# Patient Record
Sex: Male | Born: 1945 | Race: White | Hispanic: No | Marital: Married | Smoking: Never smoker
Health system: Southern US, Community
[De-identification: ages and names within clinical notes are randomized; demographics above are authoritative.]

## PROBLEM LIST (undated history)

## (undated) DIAGNOSIS — M25569 Pain in unspecified knee: Secondary | ICD-10-CM

## (undated) DIAGNOSIS — H938X9 Other specified disorders of ear, unspecified ear: Secondary | ICD-10-CM

## (undated) DIAGNOSIS — J309 Allergic rhinitis, unspecified: Secondary | ICD-10-CM

## (undated) DIAGNOSIS — R51 Headache: Secondary | ICD-10-CM

## (undated) DIAGNOSIS — M5412 Radiculopathy, cervical region: Secondary | ICD-10-CM

## (undated) DIAGNOSIS — M1990 Unspecified osteoarthritis, unspecified site: Secondary | ICD-10-CM

## (undated) DIAGNOSIS — G4733 Obstructive sleep apnea (adult) (pediatric): Secondary | ICD-10-CM

## (undated) DIAGNOSIS — I2510 Atherosclerotic heart disease of native coronary artery without angina pectoris: Secondary | ICD-10-CM

## (undated) DIAGNOSIS — I10 Essential (primary) hypertension: Secondary | ICD-10-CM

## (undated) DIAGNOSIS — Z9989 Dependence on other enabling machines and devices: Secondary | ICD-10-CM

## (undated) DIAGNOSIS — M722 Plantar fascial fibromatosis: Secondary | ICD-10-CM

## (undated) DIAGNOSIS — N529 Male erectile dysfunction, unspecified: Secondary | ICD-10-CM

## (undated) DIAGNOSIS — T07XXXA Unspecified multiple injuries, initial encounter: Secondary | ICD-10-CM

## (undated) DIAGNOSIS — F32A Depression, unspecified: Secondary | ICD-10-CM

## (undated) DIAGNOSIS — L709 Acne, unspecified: Secondary | ICD-10-CM

## (undated) DIAGNOSIS — F329 Major depressive disorder, single episode, unspecified: Secondary | ICD-10-CM

## (undated) DIAGNOSIS — R519 Headache, unspecified: Secondary | ICD-10-CM

## (undated) DIAGNOSIS — E669 Obesity, unspecified: Secondary | ICD-10-CM

## (undated) HISTORY — DX: Male erectile dysfunction, unspecified: N52.9

## (undated) HISTORY — PX: TYMPANOSTOMY TUBE PLACEMENT: SHX32

## (undated) HISTORY — DX: Atherosclerotic heart disease of native coronary artery without angina pectoris: I25.10

## (undated) HISTORY — DX: Obesity, unspecified: E66.9

## (undated) HISTORY — PX: CARDIAC CATHETERIZATION: SHX172

## (undated) HISTORY — DX: Essential (primary) hypertension: I10

## (undated) HISTORY — DX: Allergic rhinitis, unspecified: J30.9

## (undated) HISTORY — DX: Pain in unspecified knee: M25.569

## (undated) HISTORY — DX: Unspecified osteoarthritis, unspecified site: M19.90

## (undated) HISTORY — DX: Plantar fascial fibromatosis: M72.2

## (undated) HISTORY — PX: ELBOW SURGERY: SHX618

## (undated) HISTORY — DX: Obstructive sleep apnea (adult) (pediatric): G47.33

## (undated) HISTORY — DX: Acne, unspecified: L70.9

## (undated) HISTORY — DX: Other specified disorders of ear, unspecified ear: H93.8X9

## (undated) HISTORY — DX: Radiculopathy, cervical region: M54.12

## (undated) HISTORY — DX: Depression, unspecified: F32.A

## (undated) HISTORY — DX: Major depressive disorder, single episode, unspecified: F32.9

## (undated) HISTORY — DX: Dependence on other enabling machines and devices: Z99.89

## (undated) HISTORY — PX: TONSILLECTOMY: SUR1361

## (undated) HISTORY — PX: NASAL SINUS SURGERY: SHX719

## (undated) HISTORY — PX: HERNIA REPAIR: SHX51

## (undated) HISTORY — PX: KNEE ARTHROSCOPY: SHX127

## (undated) HISTORY — DX: Unspecified multiple injuries, initial encounter: T07.XXXA

---

## 1998-04-14 HISTORY — PX: CORONARY ARTERY BYPASS GRAFT: SHX141

## 2006-07-08 ENCOUNTER — Encounter: Admission: RE | Admit: 2006-07-08 | Discharge: 2006-07-08 | Payer: Self-pay | Admitting: Interventional Cardiology

## 2007-09-02 ENCOUNTER — Encounter: Admission: RE | Admit: 2007-09-02 | Discharge: 2007-09-02 | Payer: Self-pay | Admitting: Internal Medicine

## 2009-05-25 ENCOUNTER — Ambulatory Visit (HOSPITAL_BASED_OUTPATIENT_CLINIC_OR_DEPARTMENT_OTHER): Admission: RE | Admit: 2009-05-25 | Discharge: 2009-05-25 | Payer: Self-pay | Admitting: Orthopedic Surgery

## 2010-04-19 ENCOUNTER — Ambulatory Visit
Admission: RE | Admit: 2010-04-19 | Discharge: 2010-04-19 | Payer: Self-pay | Source: Home / Self Care | Attending: Orthopedic Surgery | Admitting: Orthopedic Surgery

## 2010-04-19 LAB — POCT I-STAT, CHEM 8
BUN: 20 mg/dL (ref 6–23)
Calcium, Ion: 1.08 mmol/L — ABNORMAL LOW (ref 1.12–1.32)
Chloride: 106 mEq/L (ref 96–112)
Creatinine, Ser: 1 mg/dL (ref 0.4–1.5)
Glucose, Bld: 133 mg/dL — ABNORMAL HIGH (ref 70–99)
HCT: 43 % (ref 39.0–52.0)
Hemoglobin: 14.6 g/dL (ref 13.0–17.0)
Potassium: 3.7 mEq/L (ref 3.5–5.1)
Sodium: 137 mEq/L (ref 135–145)
TCO2: 26 mmol/L (ref 0–100)

## 2010-05-03 NOTE — Op Note (Signed)
  NAMEOMARIAN, Scott Welch                ACCOUNT NO.:  [id number]  MEDICAL RECORD NO.:  [id number]          PATIENT TYPE:  AMB  LOCATION:  DSC                          FACILITY:  [facility]  PHYSICIAN:  Harvie Junior, M.D.   DATE OF BIRTH:  [birth year]  DATE OF PROCEDURE:  04/19/2010 DATE OF DISCHARGE:                              OPERATIVE REPORT   PREOPERATIVE DIAGNOSIS:  Medial meniscal tear.  POSTOPERATIVE DIAGNOSES: 1. Medial meniscal tear. 2. Lateral meniscal tear. 3. Significant chondromalacia lateral femoral condyle. 4. Chondromalacia patellofemoral joint.  PROCEDURE: 1. Partial medial and partial lateral meniscectomy with corresponding     debridement in the mediolateral compartment. 2. Debridement of chondromalacia of the patella down to bleeding bone.  SURGEON:  Harvie Junior, MD  ASSISTANT:  Marshia Ly, PA  ANESTHESIA:  General.  BRIEF HISTORY:  Mr. Scott Welch is a 65 year old male with history of having significant bilateral knee pain.  We treated his left knee with a knee arthroscopy done, but that ultimately began having increasing and similar symptoms on the right knee after failure of conservative care. He was ultimately taken to the operating room for right knee arthroscopy.  PROCEDURE:  The patient was taken to the operating room.  After adequate anesthesia was obtained with general anesthetic, the patient was placed supine on the operating table.  The right leg was prepped and draped in usual sterile fashion.  Following this, the knee was examined under anesthesia and noted to be stable in all directions.  The arthroscopy instruments were then placed, and attention was turned to the medial compartments and noted to have significant calcification in the meniscus.  Inferiorly, I think it was some chondrocalcinosis, but ultimately we did a debridement of the posterior horn of the medial meniscus back to a smooth and stable rim.  Medial femoral condyle had  no significant chondromalacia.  Attention was turned to the ACL.  Normal lateral side showed grade 2 and 3 chondromalacia of the lateral femoral condyle over a pretty large area, probably 2 x 2 cm.  The lateral meniscus was torn at its periphery and we debrided this back to a stable rim.  Attention was then turned up to the patellofemoral joint where some grade 3 and 4 changes debrided back to a stable rim of bleeding bone.  At this point, the knee was copiously and thoroughly lavaged and suctioned dry.  The arthroscopic portals were closed with a bandage.  Sterile compression was applied. The patient taken to recovery room where he was noted to be in satisfactory condition.  Estimated blood loss for the procedure was none.     Harvie Junior, M.D.     Ranae Plumber  D:  04/19/2010  T:  04/20/2010  Job:  [id number]  Electronically Signed by Jodi Geralds M.D. on 05/02/2010 08:59:22 PM

## 2010-07-04 LAB — POCT HEMOGLOBIN-HEMACUE: Hemoglobin: 15.4 g/dL (ref 13.0–17.0)

## 2010-07-04 LAB — BASIC METABOLIC PANEL
BUN: 23 mg/dL (ref 6–23)
CO2: 26 mEq/L (ref 19–32)
Calcium: 9.3 mg/dL (ref 8.4–10.5)
Chloride: 102 mEq/L (ref 96–112)
Creatinine, Ser: 1.08 mg/dL (ref 0.4–1.5)
GFR calc Af Amer: >60 mL/min (ref >60)
GFR calc non Af Amer: >60 mL/min (ref >60)
Glucose, Bld: 119 mg/dL — ABNORMAL HIGH (ref 70–99)
Potassium: 4.1 mEq/L (ref 3.5–5.1)
Sodium: 135 mEq/L (ref 135–145)

## 2011-07-23 DIAGNOSIS — D1800 Hemangioma unspecified site: Secondary | ICD-10-CM | POA: Diagnosis not present

## 2011-07-23 DIAGNOSIS — L570 Actinic keratosis: Secondary | ICD-10-CM | POA: Diagnosis not present

## 2011-07-23 DIAGNOSIS — L821 Other seborrheic keratosis: Secondary | ICD-10-CM | POA: Diagnosis not present

## 2011-07-23 DIAGNOSIS — Z85828 Personal history of other malignant neoplasm of skin: Secondary | ICD-10-CM | POA: Diagnosis not present

## 2011-12-02 DIAGNOSIS — R51 Headache: Secondary | ICD-10-CM | POA: Diagnosis not present

## 2011-12-30 DIAGNOSIS — Z1211 Encounter for screening for malignant neoplasm of colon: Secondary | ICD-10-CM | POA: Diagnosis not present

## 2011-12-30 DIAGNOSIS — K5730 Diverticulosis of large intestine without perforation or abscess without bleeding: Secondary | ICD-10-CM | POA: Diagnosis not present

## 2012-01-01 DIAGNOSIS — M1710 Unilateral primary osteoarthritis, unspecified knee: Secondary | ICD-10-CM | POA: Diagnosis not present

## 2012-01-01 DIAGNOSIS — M25569 Pain in unspecified knee: Secondary | ICD-10-CM | POA: Diagnosis not present

## 2012-01-12 DIAGNOSIS — M1710 Unilateral primary osteoarthritis, unspecified knee: Secondary | ICD-10-CM | POA: Diagnosis not present

## 2012-01-21 DIAGNOSIS — I10 Essential (primary) hypertension: Secondary | ICD-10-CM | POA: Diagnosis not present

## 2012-01-21 DIAGNOSIS — E785 Hyperlipidemia, unspecified: Secondary | ICD-10-CM | POA: Diagnosis not present

## 2012-01-21 DIAGNOSIS — I2510 Atherosclerotic heart disease of native coronary artery without angina pectoris: Secondary | ICD-10-CM | POA: Diagnosis not present

## 2012-02-03 DIAGNOSIS — F325 Major depressive disorder, single episode, in full remission: Secondary | ICD-10-CM | POA: Diagnosis not present

## 2012-02-03 DIAGNOSIS — Z125 Encounter for screening for malignant neoplasm of prostate: Secondary | ICD-10-CM | POA: Diagnosis not present

## 2012-02-03 DIAGNOSIS — E785 Hyperlipidemia, unspecified: Secondary | ICD-10-CM | POA: Diagnosis not present

## 2012-02-03 DIAGNOSIS — Z0000 Encounter for general adult medical examination without abnormal findings: Secondary | ICD-10-CM | POA: Diagnosis not present

## 2012-02-03 DIAGNOSIS — Z1331 Encounter for screening for depression: Secondary | ICD-10-CM | POA: Diagnosis not present

## 2012-02-03 DIAGNOSIS — I10 Essential (primary) hypertension: Secondary | ICD-10-CM | POA: Diagnosis not present

## 2012-02-03 DIAGNOSIS — Z23 Encounter for immunization: Secondary | ICD-10-CM | POA: Diagnosis not present

## 2012-02-03 DIAGNOSIS — G4733 Obstructive sleep apnea (adult) (pediatric): Secondary | ICD-10-CM | POA: Diagnosis not present

## 2012-05-18 DIAGNOSIS — M1710 Unilateral primary osteoarthritis, unspecified knee: Secondary | ICD-10-CM | POA: Diagnosis not present

## 2012-07-29 DIAGNOSIS — M1710 Unilateral primary osteoarthritis, unspecified knee: Secondary | ICD-10-CM | POA: Diagnosis not present

## 2012-08-03 DIAGNOSIS — M25569 Pain in unspecified knee: Secondary | ICD-10-CM | POA: Diagnosis not present

## 2012-08-03 DIAGNOSIS — F329 Major depressive disorder, single episode, unspecified: Secondary | ICD-10-CM | POA: Diagnosis not present

## 2012-08-03 DIAGNOSIS — I10 Essential (primary) hypertension: Secondary | ICD-10-CM | POA: Diagnosis not present

## 2012-08-05 DIAGNOSIS — M1710 Unilateral primary osteoarthritis, unspecified knee: Secondary | ICD-10-CM | POA: Diagnosis not present

## 2012-08-05 DIAGNOSIS — L821 Other seborrheic keratosis: Secondary | ICD-10-CM | POA: Diagnosis not present

## 2012-08-05 DIAGNOSIS — Z85828 Personal history of other malignant neoplasm of skin: Secondary | ICD-10-CM | POA: Diagnosis not present

## 2012-08-05 DIAGNOSIS — L570 Actinic keratosis: Secondary | ICD-10-CM | POA: Diagnosis not present

## 2012-08-05 DIAGNOSIS — I781 Nevus, non-neoplastic: Secondary | ICD-10-CM | POA: Diagnosis not present

## 2012-08-12 DIAGNOSIS — M1710 Unilateral primary osteoarthritis, unspecified knee: Secondary | ICD-10-CM | POA: Diagnosis not present

## 2012-08-12 DIAGNOSIS — M25569 Pain in unspecified knee: Secondary | ICD-10-CM | POA: Diagnosis not present

## 2012-08-24 DIAGNOSIS — M1710 Unilateral primary osteoarthritis, unspecified knee: Secondary | ICD-10-CM | POA: Diagnosis not present

## 2012-12-22 DIAGNOSIS — L570 Actinic keratosis: Secondary | ICD-10-CM | POA: Diagnosis not present

## 2012-12-22 DIAGNOSIS — D234 Other benign neoplasm of skin of scalp and neck: Secondary | ICD-10-CM | POA: Diagnosis not present

## 2012-12-22 DIAGNOSIS — Z85828 Personal history of other malignant neoplasm of skin: Secondary | ICD-10-CM | POA: Diagnosis not present

## 2012-12-22 DIAGNOSIS — L820 Inflamed seborrheic keratosis: Secondary | ICD-10-CM | POA: Diagnosis not present

## 2013-01-20 ENCOUNTER — Encounter: Payer: Self-pay | Admitting: Interventional Cardiology

## 2013-01-21 ENCOUNTER — Ambulatory Visit: Payer: Self-pay | Admitting: Interventional Cardiology

## 2013-02-01 DIAGNOSIS — G4733 Obstructive sleep apnea (adult) (pediatric): Secondary | ICD-10-CM | POA: Diagnosis not present

## 2013-02-01 DIAGNOSIS — I10 Essential (primary) hypertension: Secondary | ICD-10-CM | POA: Diagnosis not present

## 2013-02-01 DIAGNOSIS — E785 Hyperlipidemia, unspecified: Secondary | ICD-10-CM | POA: Diagnosis not present

## 2013-02-01 DIAGNOSIS — Z23 Encounter for immunization: Secondary | ICD-10-CM | POA: Diagnosis not present

## 2013-02-03 DIAGNOSIS — M25569 Pain in unspecified knee: Secondary | ICD-10-CM | POA: Diagnosis not present

## 2013-02-15 ENCOUNTER — Other Ambulatory Visit: Payer: Self-pay | Admitting: Orthopedic Surgery

## 2013-02-22 ENCOUNTER — Encounter: Payer: Self-pay | Admitting: Interventional Cardiology

## 2013-02-22 ENCOUNTER — Ambulatory Visit (INDEPENDENT_AMBULATORY_CARE_PROVIDER_SITE_OTHER): Payer: Medicare Other | Admitting: Interventional Cardiology

## 2013-02-22 VITALS — BP 144/80 | HR 66 | Ht 74.0 in | Wt 248.0 lb

## 2013-02-22 DIAGNOSIS — Z01810 Encounter for preprocedural cardiovascular examination: Secondary | ICD-10-CM

## 2013-02-22 DIAGNOSIS — G4733 Obstructive sleep apnea (adult) (pediatric): Secondary | ICD-10-CM | POA: Diagnosis not present

## 2013-02-22 DIAGNOSIS — I25709 Atherosclerosis of coronary artery bypass graft(s), unspecified, with unspecified angina pectoris: Secondary | ICD-10-CM | POA: Insufficient documentation

## 2013-02-22 DIAGNOSIS — I10 Essential (primary) hypertension: Secondary | ICD-10-CM | POA: Diagnosis not present

## 2013-02-22 DIAGNOSIS — IMO0002 Reserved for concepts with insufficient information to code with codable children: Secondary | ICD-10-CM

## 2013-02-22 DIAGNOSIS — I2510 Atherosclerotic heart disease of native coronary artery without angina pectoris: Secondary | ICD-10-CM | POA: Diagnosis not present

## 2013-02-22 DIAGNOSIS — E785 Hyperlipidemia, unspecified: Secondary | ICD-10-CM | POA: Diagnosis not present

## 2013-02-22 DIAGNOSIS — M1710 Unilateral primary osteoarthritis, unspecified knee: Secondary | ICD-10-CM

## 2013-02-22 DIAGNOSIS — M1712 Unilateral primary osteoarthritis, left knee: Secondary | ICD-10-CM | POA: Insufficient documentation

## 2013-02-22 NOTE — Patient Instructions (Signed)
Your physician recommends that you continue on your current medications as directed. Please refer to the Current Medication list given to you today.  Your physician wants you to follow-up in: 1 year You will receive a reminder letter in the mail two months in advance. If you don't receive a letter, please call our office to schedule the follow-up appointment.  You are cleared to have your left knee surgery in December

## 2013-02-22 NOTE — Progress Notes (Signed)
Patient ID: Scott Welch, male   DOB: 1945/11/29, 67 y.o.   MRN: [id number]    [address] [place], [place]  [address] Phone: [phone] Fax:  [phone]  Date:  02/22/2013   ID:  Scott Welch, DOB 06-Feb-1946, MRN [id number]  PCP:  Lillia Mountain, MD   ASSESSMENT:  1. Coronary atherosclerotic heart disease, stable without angina 2. Hypertension with good control 3. Hyperlipidemia, on therapy with Lipitor 4. Chronic left knee osteoarthritis, with upcoming left knee replacement by Dr. Luiz Blare  PLAN:  1. Patient is cleared for the upcoming left knee operation. Percent today's note for Dr. Luiz Blare. The surgical  risk should be low to moderate (less than 5%) 2. Active lifestyle 3. Clinical followup in one year 4. No change in medical therapy   SUBJECTIVE: Scott Welch is a 67 y.o. male is having some difficulty sleeping because of sleep apnea and poorly fitting device. He has not had angina. He recently spent a week in [place], where he walked up to 5 miles each day without difficulty. He denies orthopnea, PND, chest pain, edema, and palpitations. He does get leg cramps. While in [place] he takes supplemental potassium and this seemed to help the cramping. His only limitation during the trip was pain in his left knee. This will be operated upon by Dr. Luiz Blare in early December. He will be a total knee replacement.   Wt Readings from Last 3 Encounters:  02/22/13 248 lb (112.492 kg)     Past Medical History  Diagnosis Date  . Hypertension   . Depression   . Knee pain     followed by Dr. Luiz Blare at [facility]  . Coronary artery disease     one-vessel followed by Dr. Katrinka Blazing  . OSA on CPAP     since 1995. BIPAP  . Obesity   . Erectile dysfunction   . Osteoarthritis     left knee, Dr. Luiz Blare  . Allergic rhinitis   . Plantar fasciitis   . Cervical radiculopathy     2008  . Acne   . DJD (degenerative joint disease)    [place]  . Ear congestion     ear tubes, [place]  . Fractures     left elbow, right elbow, nose, multiple fingers, left clavicle    Current Outpatient Prescriptions  Medication Sig Dispense Refill  . aspirin 81 MG tablet Take 81 mg by mouth daily.      Marland Kitchen atorvastatin (LIPITOR) 20 MG tablet Take 20 mg by mouth daily.      . cetirizine (ZYRTEC) 10 MG tablet Take 10 mg by mouth daily.      . cholecalciferol (VITAMIN D) 1000 UNITS tablet Take 1,000 Units by mouth daily.      . fluticasone (FLONASE) 50 MCG/ACT nasal spray Place 2 sprays into the nose daily.      . Ginkgo Biloba (GINKOBA) 40 MG TABS Take 40 mg by mouth.      Marland Kitchen lisinopril (PRINIVIL,ZESTRIL) 20 MG tablet Take 20 mg by mouth daily.      . Misc Natural Products (OSTEO BI-FLEX TRIPLE STRENGTH PO) Take by mouth.      . Multiple Vitamin (MULTIVITAMIN) tablet Take 1 tablet by mouth daily.      . sildenafil (VIAGRA) 50 MG tablet Take 50 mg by mouth daily as needed for erectile dysfunction.       No current facility-administered medications for this visit.  Allergies:   No Known Allergies  History   Social History  . Marital Status: Married    Spouse Name: N/A    Number of Children: N/A  . Years of Education: N/A   Occupational History  . Not on file.   Social History Main Topics  . Smoking status: Never Smoker   . Smokeless tobacco: Not on file  . Alcohol Use: 8.4 oz/week    14 Glasses of wine per week  . Drug Use: Not on file  . Sexual Activity: Not on file   Other Topics Concern  . Not on file   Social History Narrative  . No narrative on file    ROS:  Please see the history of present illness.   Denies claudication, transient neurological symptoms, bleeding, syncope, orthopnea, PND, and medication side effects.   All other systems reviewed and negative.   OBJECTIVE: VS:  BP 144/80  Pulse 66  Ht 6\' 2"  (1.88 m)  Wt 248 lb (112.492 kg)  BMI 31.83 kg/m2 Well nourished, well developed, in no acute  distress,  obese  HEENT: normal Neck: JVD  flat. Carotid bruit  absent   Cardiac:  normal S1, S2; RRR; no murmur Lungs: Clear to auscultation bilaterally, no wheezing, rhonchi or rales Abd: soft, nontender, no hepatomegaly Ext: Edema trace to 1+ bilateral. Pulses trace to 1+ bilateral Skin: warm and dry Neuro:  CNs 2-12 intact, no focal abnormalities noted  EKG:  normal sinus rhythm, PVCs, occasionally and a bigeminal pattern.    Signed, Darci Needle III, MD 02/22/2013 12:13 PM

## 2013-02-24 DIAGNOSIS — G4710 Hypersomnia, unspecified: Secondary | ICD-10-CM | POA: Diagnosis not present

## 2013-02-24 DIAGNOSIS — G4733 Obstructive sleep apnea (adult) (pediatric): Secondary | ICD-10-CM | POA: Diagnosis not present

## 2013-02-24 DIAGNOSIS — R0609 Other forms of dyspnea: Secondary | ICD-10-CM | POA: Diagnosis not present

## 2013-02-24 DIAGNOSIS — G4730 Sleep apnea, unspecified: Secondary | ICD-10-CM | POA: Diagnosis not present

## 2013-03-07 ENCOUNTER — Encounter (HOSPITAL_COMMUNITY): Payer: Self-pay

## 2013-03-07 NOTE — Pre-Procedure Instructions (Signed)
Scott Welch  03/07/2013   Your procedure is scheduled on:  December 5th-Friday   Report to [facility]  2 * 3 at 8:00 AM.             [facility] Parking is available)  Call this number if you have problems the morning of surgery: [phone]   Remember:   Do not eat food or drink liquids after midnight Thursday.   Take these medicines the morning of surgery with A SIP OF WATER: Lexapro, Zyrtec.  Please use flonase too.   Do not wear jewelry.  Do not wear lotions, powders, or colognes. You may wear deodorant.    Men may shave face and neck.  Do not bring valuables to the hospital.  [facility] is not responsible for any belongings or valuables.               Contacts, dentures or bridgework may not be worn into surgery.  Leave suitcase in the car. After surgery it may be brought to your room.  For patients admitted to the hospital, discharge time is determined by your treatment team.              Name and phone number of your driver:    Special Instructions: Shower using CHG 2 nights before surgery and the night before surgery.  If you shower the day of surgery use CHG.  Use special wash - you have one bottle of CHG for all showers.  You should use approximately 1/3 of the bottle for each shower.   Please read over the following fact sheets that you were given: Pain Booklet, Coughing and Deep Breathing, Blood Transfusion Information, MRSA Information and Surgical Site Infection Prevention

## 2013-03-08 ENCOUNTER — Encounter (HOSPITAL_COMMUNITY): Payer: Self-pay

## 2013-03-08 ENCOUNTER — Encounter (HOSPITAL_COMMUNITY)
Admission: RE | Admit: 2013-03-08 | Discharge: 2013-03-08 | Disposition: A | Discharge status: Home / Self Care | Payer: Medicare Other | Source: Ambulatory Visit | Attending: Orthopedic Surgery | Admitting: Orthopedic Surgery

## 2013-03-08 DIAGNOSIS — Z01818 Encounter for other preprocedural examination: Secondary | ICD-10-CM | POA: Diagnosis not present

## 2013-03-08 DIAGNOSIS — I10 Essential (primary) hypertension: Secondary | ICD-10-CM | POA: Diagnosis not present

## 2013-03-08 DIAGNOSIS — Z01812 Encounter for preprocedural laboratory examination: Secondary | ICD-10-CM | POA: Insufficient documentation

## 2013-03-08 LAB — COMPREHENSIVE METABOLIC PANEL
ALT: 26 U/L (ref 0–53)
AST: 21 U/L (ref 0–37)
Calcium: 9 mg/dL (ref 8.4–10.5)
Creatinine, Ser: 1.03 mg/dL (ref 0.50–1.35)
GFR calc Af Amer: 84 mL/min — ABNORMAL LOW (ref >90)
Glucose, Bld: 111 mg/dL — ABNORMAL HIGH (ref 70–99)
Sodium: 140 mEq/L (ref 135–145)
Total Protein: 6.4 g/dL (ref 6.0–8.3)

## 2013-03-08 LAB — URINALYSIS, ROUTINE W REFLEX MICROSCOPIC
Bilirubin Urine: NEGATIVE
Glucose, UA: NEGATIVE mg/dL
Hgb urine dipstick: NEGATIVE
Ketones, ur: NEGATIVE mg/dL
Protein, ur: NEGATIVE mg/dL
Urobilinogen, UA: 0.2 mg/dL (ref 0.0–1.0)
pH: 6.5 (ref 5.0–8.0)

## 2013-03-08 LAB — TYPE AND SCREEN
ABO/RH(D): A POS
Antibody Screen: NEGATIVE

## 2013-03-08 LAB — SURGICAL PCR SCREEN
MRSA, PCR: NEGATIVE
Staphylococcus aureus: NEGATIVE

## 2013-03-08 LAB — CBC WITH DIFFERENTIAL/PLATELET
Basophils Absolute: 0.1 10*3/uL (ref 0.0–0.1)
Basophils Relative: 1 % (ref 0–1)
Eosinophils Absolute: 0.3 10*3/uL (ref 0.0–0.7)
Eosinophils Relative: 6 % — ABNORMAL HIGH (ref 0–5)
Lymphs Abs: 1.3 10*3/uL (ref 0.7–4.0)
MCH: 32 pg (ref 26.0–34.0)
MCV: 93.3 fL (ref 78.0–100.0)
Monocytes Absolute: 0.4 10*3/uL (ref 0.1–1.0)
Platelets: 165 10*3/uL (ref 150–400)
RDW: 12.5 % (ref 11.5–15.5)

## 2013-03-08 LAB — ABO/RH: ABO/RH(D): A POS

## 2013-03-08 LAB — PROTIME-INR: INR: 1.02 (ref 0.00–1.49)

## 2013-03-17 MED ORDER — CEFAZOLIN SODIUM-DEXTROSE 2-3 GM-% IV SOLR
2.0000 g | INTRAVENOUS | Status: AC
  Administered 2013-03-18: 2 g via INTRAVENOUS
  Filled 2013-03-17: qty 50

## 2013-03-18 ENCOUNTER — Encounter (HOSPITAL_COMMUNITY): Payer: Self-pay

## 2013-03-18 ENCOUNTER — Inpatient Hospital Stay (HOSPITAL_COMMUNITY)
Admission: RE | Admit: 2013-03-18 | Discharge: 2013-03-20 | DRG: 470 | Disposition: A | Discharge status: Home / Self Care | Payer: Medicare Other | Source: Ambulatory Visit | Attending: Orthopedic Surgery | Admitting: Orthopedic Surgery

## 2013-03-18 ENCOUNTER — Inpatient Hospital Stay (HOSPITAL_COMMUNITY): Payer: Medicare Other | Admitting: Anesthesiology

## 2013-03-18 ENCOUNTER — Encounter (HOSPITAL_COMMUNITY): Payer: Medicare Other | Admitting: Anesthesiology

## 2013-03-18 ENCOUNTER — Encounter (HOSPITAL_COMMUNITY)
Admission: RE | Disposition: A | Discharge status: Home / Self Care | Payer: Self-pay | Source: Ambulatory Visit | Attending: Orthopedic Surgery

## 2013-03-18 DIAGNOSIS — Z951 Presence of aortocoronary bypass graft: Secondary | ICD-10-CM

## 2013-03-18 DIAGNOSIS — F3289 Other specified depressive episodes: Secondary | ICD-10-CM | POA: Diagnosis not present

## 2013-03-18 DIAGNOSIS — Z7982 Long term (current) use of aspirin: Secondary | ICD-10-CM | POA: Diagnosis not present

## 2013-03-18 DIAGNOSIS — M1710 Unilateral primary osteoarthritis, unspecified knee: Principal | ICD-10-CM | POA: Diagnosis present

## 2013-03-18 DIAGNOSIS — Z79899 Other long term (current) drug therapy: Secondary | ICD-10-CM | POA: Diagnosis not present

## 2013-03-18 DIAGNOSIS — Z9861 Coronary angioplasty status: Secondary | ICD-10-CM | POA: Diagnosis not present

## 2013-03-18 DIAGNOSIS — M1712 Unilateral primary osteoarthritis, left knee: Secondary | ICD-10-CM | POA: Diagnosis present

## 2013-03-18 DIAGNOSIS — E785 Hyperlipidemia, unspecified: Secondary | ICD-10-CM | POA: Diagnosis present

## 2013-03-18 DIAGNOSIS — IMO0002 Reserved for concepts with insufficient information to code with codable children: Secondary | ICD-10-CM | POA: Diagnosis not present

## 2013-03-18 DIAGNOSIS — I10 Essential (primary) hypertension: Secondary | ICD-10-CM | POA: Diagnosis present

## 2013-03-18 DIAGNOSIS — G8918 Other acute postprocedural pain: Secondary | ICD-10-CM | POA: Diagnosis not present

## 2013-03-18 DIAGNOSIS — I2510 Atherosclerotic heart disease of native coronary artery without angina pectoris: Secondary | ICD-10-CM | POA: Diagnosis present

## 2013-03-18 DIAGNOSIS — M25569 Pain in unspecified knee: Secondary | ICD-10-CM | POA: Diagnosis not present

## 2013-03-18 DIAGNOSIS — E669 Obesity, unspecified: Secondary | ICD-10-CM | POA: Diagnosis present

## 2013-03-18 DIAGNOSIS — F329 Major depressive disorder, single episode, unspecified: Secondary | ICD-10-CM | POA: Diagnosis present

## 2013-03-18 DIAGNOSIS — G4733 Obstructive sleep apnea (adult) (pediatric): Secondary | ICD-10-CM | POA: Diagnosis present

## 2013-03-18 HISTORY — PX: TOTAL KNEE ARTHROPLASTY: SHX125

## 2013-03-18 SURGERY — ARTHROPLASTY, KNEE, TOTAL
Anesthesia: General | Site: Knee | Laterality: Left

## 2013-03-18 MED ORDER — ACETAMINOPHEN 325 MG PO TABS: 650.0000 mg | ORAL_TABLET | Freq: Four times a day (QID) | ORAL | Status: DC | PRN

## 2013-03-18 MED ORDER — LISINOPRIL 20 MG PO TABS
20.0000 mg | ORAL_TABLET | Freq: Every day | ORAL | Status: DC
  Administered 2013-03-18 – 2013-03-20 (×3): 20 mg via ORAL
  Filled 2013-03-18 (×3): qty 1

## 2013-03-18 MED ORDER — HYDROMORPHONE HCL PF 1 MG/ML IJ SOLN: 1.0000 mg | INTRAMUSCULAR | Status: DC | PRN

## 2013-03-18 MED ORDER — TRANEXAMIC ACID 100 MG/ML IV SOLN
1000.0000 mg | INTRAVENOUS | Status: AC
  Administered 2013-03-18: 1000 mg via INTRAVENOUS
  Filled 2013-03-18: qty 10

## 2013-03-18 MED ORDER — METHOCARBAMOL 500 MG PO TABS
500.0000 mg | ORAL_TABLET | Freq: Four times a day (QID) | ORAL | Status: DC | PRN
  Administered 2013-03-20: 500 mg via ORAL
  Filled 2013-03-18: qty 1

## 2013-03-18 MED ORDER — ACETAMINOPHEN 650 MG RE SUPP: 650.0000 mg | Freq: Four times a day (QID) | RECTAL | Status: DC | PRN

## 2013-03-18 MED ORDER — LACTATED RINGERS IV SOLN
INTRAVENOUS | Status: DC
  Administered 2013-03-18 (×2): via INTRAVENOUS

## 2013-03-18 MED ORDER — PROMETHAZINE HCL 25 MG/ML IJ SOLN: 6.2500 mg | INTRAMUSCULAR | Status: DC | PRN

## 2013-03-18 MED ORDER — BUPIVACAINE LIPOSOME 1.3 % IJ SUSP
20.0000 mL | INTRAMUSCULAR | Status: DC
  Filled 2013-03-18: qty 20

## 2013-03-18 MED ORDER — ZOLPIDEM TARTRATE 5 MG PO TABS
5.0000 mg | ORAL_TABLET | Freq: Every evening | ORAL | Status: DC | PRN
Start: 2013-03-18 — End: 2013-03-20

## 2013-03-18 MED ORDER — CEFAZOLIN SODIUM-DEXTROSE 2-3 GM-% IV SOLR
2.0000 g | Freq: Four times a day (QID) | INTRAVENOUS | Status: AC
  Administered 2013-03-18 (×2): 2 g via INTRAVENOUS
  Filled 2013-03-18 (×2): qty 50

## 2013-03-18 MED ORDER — KETOROLAC TROMETHAMINE 30 MG/ML IJ SOLN
INTRAMUSCULAR | Status: AC
  Filled 2013-03-18: qty 1

## 2013-03-18 MED ORDER — HYDROMORPHONE HCL PF 1 MG/ML IJ SOLN
INTRAMUSCULAR | Status: AC
  Filled 2013-03-18: qty 1

## 2013-03-18 MED ORDER — ASPIRIN EC 325 MG PO TBEC
325.0000 mg | DELAYED_RELEASE_TABLET | Freq: Two times a day (BID) | ORAL | Status: DC
  Administered 2013-03-18 – 2013-03-20 (×4): 325 mg via ORAL
  Filled 2013-03-18 (×6): qty 1

## 2013-03-18 MED ORDER — OXYCODONE HCL 5 MG PO TABS
5.0000 mg | ORAL_TABLET | Freq: Once | ORAL | Status: DC | PRN
Start: 2013-03-18 — End: 2013-03-18

## 2013-03-18 MED ORDER — HYDROMORPHONE HCL PF 1 MG/ML IJ SOLN: 0.2500 mg | INTRAMUSCULAR | Status: DC | PRN

## 2013-03-18 MED ORDER — DOCUSATE SODIUM 100 MG PO CAPS
100.0000 mg | ORAL_CAPSULE | Freq: Two times a day (BID) | ORAL | Status: DC
  Administered 2013-03-18 – 2013-03-20 (×5): 100 mg via ORAL
  Filled 2013-03-18 (×5): qty 1

## 2013-03-18 MED ORDER — PHENYLEPHRINE HCL 10 MG/ML IJ SOLN
INTRAMUSCULAR | Status: DC | PRN
  Administered 2013-03-18: 80 ug via INTRAVENOUS

## 2013-03-18 MED ORDER — ESCITALOPRAM OXALATE 10 MG PO TABS
10.0000 mg | ORAL_TABLET | Freq: Every day | ORAL | Status: DC
  Administered 2013-03-18 – 2013-03-20 (×3): 10 mg via ORAL
  Filled 2013-03-18 (×3): qty 1

## 2013-03-18 MED ORDER — ONDANSETRON HCL 4 MG PO TABS: 4.0000 mg | ORAL_TABLET | Freq: Four times a day (QID) | ORAL | Status: DC | PRN

## 2013-03-18 MED ORDER — LIDOCAINE HCL (CARDIAC) 20 MG/ML IV SOLN
INTRAVENOUS | Status: DC | PRN
  Administered 2013-03-18: 40 mg via INTRAVENOUS

## 2013-03-18 MED ORDER — DEXAMETHASONE SODIUM PHOSPHATE 10 MG/ML IJ SOLN
INTRAMUSCULAR | Status: DC | PRN
  Administered 2013-03-18: 6 mg

## 2013-03-18 MED ORDER — PROMETHAZINE HCL 25 MG/ML IJ SOLN: 12.5000 mg | Freq: Four times a day (QID) | INTRAMUSCULAR | Status: DC | PRN

## 2013-03-18 MED ORDER — METHOCARBAMOL 100 MG/ML IJ SOLN
500.0000 mg | Freq: Four times a day (QID) | INTRAMUSCULAR | Status: DC | PRN
  Filled 2013-03-18: qty 5

## 2013-03-18 MED ORDER — POVIDONE-IODINE 7.5 % EX SOLN
Freq: Once | CUTANEOUS | Status: DC
  Filled 2013-03-18: qty 118

## 2013-03-18 MED ORDER — SODIUM CHLORIDE 0.9 % IR SOLN
Status: DC | PRN
  Administered 2013-03-18: 3000 mL

## 2013-03-18 MED ORDER — MIDAZOLAM HCL 2 MG/2ML IJ SOLN
INTRAMUSCULAR | Status: AC
  Administered 2013-03-18: 2 mg
  Filled 2013-03-18: qty 2

## 2013-03-18 MED ORDER — POLYETHYLENE GLYCOL 3350 17 G PO PACK: 17.0000 g | PACK | Freq: Every day | ORAL | Status: DC | PRN

## 2013-03-18 MED ORDER — SODIUM CHLORIDE 0.9 % IJ SOLN
INTRAMUSCULAR | Status: DC | PRN
  Administered 2013-03-18: 11:00:00

## 2013-03-18 MED ORDER — LABETALOL HCL 5 MG/ML IV SOLN
INTRAVENOUS | Status: DC | PRN
  Administered 2013-03-18: 10 mg via INTRAVENOUS

## 2013-03-18 MED ORDER — ATORVASTATIN CALCIUM 20 MG PO TABS
20.0000 mg | ORAL_TABLET | Freq: Every day | ORAL | Status: DC
  Administered 2013-03-18 – 2013-03-20 (×3): 20 mg via ORAL
  Filled 2013-03-18 (×3): qty 1

## 2013-03-18 MED ORDER — OXYCODONE HCL 5 MG/5ML PO SOLN: 5.0000 mg | Freq: Once | ORAL | Status: DC | PRN

## 2013-03-18 MED ORDER — DIPHENHYDRAMINE HCL 12.5 MG/5ML PO ELIX: 12.5000 mg | ORAL_SOLUTION | ORAL | Status: DC | PRN

## 2013-03-18 MED ORDER — FENTANYL CITRATE 0.05 MG/ML IJ SOLN
INTRAMUSCULAR | Status: AC
  Administered 2013-03-18: 100 ug
  Filled 2013-03-18: qty 2

## 2013-03-18 MED ORDER — ALUM & MAG HYDROXIDE-SIMETH 200-200-20 MG/5ML PO SUSP: 30.0000 mL | ORAL | Status: DC | PRN

## 2013-03-18 MED ORDER — SODIUM CHLORIDE 0.9 % IV SOLN
INTRAVENOUS | Status: DC
  Administered 2013-03-18 – 2013-03-19 (×2): via INTRAVENOUS

## 2013-03-18 MED ORDER — DEXAMETHASONE 6 MG PO TABS
10.0000 mg | ORAL_TABLET | Freq: Three times a day (TID) | ORAL | Status: AC
  Administered 2013-03-18 – 2013-03-19 (×3): 10 mg via ORAL
  Filled 2013-03-18 (×3): qty 1

## 2013-03-18 MED ORDER — BISACODYL 10 MG RE SUPP: 10.0000 mg | Freq: Every day | RECTAL | Status: DC | PRN

## 2013-03-18 MED ORDER — DEXAMETHASONE SODIUM PHOSPHATE 10 MG/ML IJ SOLN
10.0000 mg | Freq: Three times a day (TID) | INTRAMUSCULAR | Status: AC
  Filled 2013-03-18 (×3): qty 1

## 2013-03-18 MED ORDER — ONDANSETRON HCL 4 MG/2ML IJ SOLN
INTRAMUSCULAR | Status: DC | PRN
  Administered 2013-03-18: 4 mg via INTRAVENOUS

## 2013-03-18 MED ORDER — KETOROLAC TROMETHAMINE 15 MG/ML IJ SOLN
7.5000 mg | Freq: Four times a day (QID) | INTRAMUSCULAR | Status: AC
  Administered 2013-03-18 – 2013-03-19 (×4): 7.5 mg via INTRAVENOUS
  Filled 2013-03-18 (×3): qty 1

## 2013-03-18 MED ORDER — PROPOFOL 10 MG/ML IV BOLUS
INTRAVENOUS | Status: DC | PRN
  Administered 2013-03-18: 200 mg via INTRAVENOUS

## 2013-03-18 MED ORDER — CEFUROXIME SODIUM 1.5 G IJ SOLR
INTRAMUSCULAR | Status: AC
  Filled 2013-03-18: qty 1.5

## 2013-03-18 MED ORDER — MIDAZOLAM HCL 5 MG/5ML IJ SOLN
INTRAMUSCULAR | Status: DC | PRN
  Administered 2013-03-18: 1 mg via INTRAVENOUS

## 2013-03-18 MED ORDER — OXYCODONE-ACETAMINOPHEN 5-325 MG PO TABS
1.0000 | ORAL_TABLET | ORAL | Status: DC | PRN
  Administered 2013-03-18: 1 via ORAL
  Administered 2013-03-19: 2 via ORAL
  Administered 2013-03-19: 1 via ORAL
  Administered 2013-03-19 – 2013-03-20 (×2): 2 via ORAL
  Administered 2013-03-20: 1 via ORAL
  Administered 2013-03-20: 2 via ORAL
  Filled 2013-03-18 (×2): qty 1
  Filled 2013-03-18 (×3): qty 2
  Filled 2013-03-18: qty 1
  Filled 2013-03-18: qty 2

## 2013-03-18 MED ORDER — FENTANYL CITRATE 0.05 MG/ML IJ SOLN
INTRAMUSCULAR | Status: DC | PRN
  Administered 2013-03-18: 50 ug via INTRAVENOUS
  Administered 2013-03-18: 100 ug via INTRAVENOUS
  Administered 2013-03-18: 250 ug via INTRAVENOUS

## 2013-03-18 MED ORDER — BUPIVACAINE-EPINEPHRINE PF 0.5-1:200000 % IJ SOLN
INTRAMUSCULAR | Status: DC | PRN
  Administered 2013-03-18: 150 mg via PERINEURAL

## 2013-03-18 MED ORDER — ONDANSETRON HCL 4 MG/2ML IJ SOLN: 4.0000 mg | Freq: Four times a day (QID) | INTRAMUSCULAR | Status: DC | PRN

## 2013-03-18 SURGICAL SUPPLY — 58 items
BANDAGE ESMARK 6X9 LF (GAUZE/BANDAGES/DRESSINGS) ×1 IMPLANT
BENZOIN TINCTURE PRP APPL 2/3 (GAUZE/BANDAGES/DRESSINGS) ×2 IMPLANT
BLADE SAGITTAL 25.0X1.19X90 (BLADE) ×2 IMPLANT
BLADE SAW SAG 90X13X1.27 (BLADE) ×2 IMPLANT
BNDG ESMARK 6X9 LF (GAUZE/BANDAGES/DRESSINGS) ×2
BOWL SMART MIX CTS (DISPOSABLE) ×2 IMPLANT
CAPT RP KNEE ×2 IMPLANT
CEMENT HV SMART SET (Cement) ×4 IMPLANT
CLOTH BEACON ORANGE TIMEOUT ST (SAFETY) ×2 IMPLANT
CLSR STERI-STRIP ANTIMIC 1/2X4 (GAUZE/BANDAGES/DRESSINGS) ×2 IMPLANT
COVER SURGICAL LIGHT HANDLE (MISCELLANEOUS) ×2 IMPLANT
CUFF TOURNIQUET SINGLE 34IN LL (TOURNIQUET CUFF) ×2 IMPLANT
CUFF TOURNIQUET SINGLE 44IN (TOURNIQUET CUFF) IMPLANT
DRAPE EXTREMITY T 121X128X90 (DRAPE) ×2 IMPLANT
DRAPE U-SHAPE 47X51 STRL (DRAPES) ×2 IMPLANT
DRSG PAD ABDOMINAL 8X10 ST (GAUZE/BANDAGES/DRESSINGS) ×2 IMPLANT
DURAPREP 26ML APPLICATOR (WOUND CARE) ×2 IMPLANT
ELECT REM PT RETURN 9FT ADLT (ELECTROSURGICAL) ×2
ELECTRODE REM PT RTRN 9FT ADLT (ELECTROSURGICAL) ×1 IMPLANT
EVACUATOR 1/8 PVC DRAIN (DRAIN) ×2 IMPLANT
FACESHIELD LNG OPTICON STERILE (SAFETY) ×2 IMPLANT
GAUZE XEROFORM 5X9 LF (GAUZE/BANDAGES/DRESSINGS) ×2 IMPLANT
GLOVE BIOGEL PI IND STRL 8 (GLOVE) ×2 IMPLANT
GLOVE BIOGEL PI INDICATOR 8 (GLOVE) ×2
GLOVE ECLIPSE 7.5 STRL STRAW (GLOVE) ×4 IMPLANT
GOWN PREVENTION PLUS LG XLONG (DISPOSABLE) IMPLANT
GOWN STRL NON-REIN LRG LVL3 (GOWN DISPOSABLE) ×2 IMPLANT
GOWN STRL REIN XL XLG (GOWN DISPOSABLE) ×4 IMPLANT
HANDPIECE INTERPULSE COAX TIP (DISPOSABLE) ×1
HOOD PEEL AWAY FACE SHEILD DIS (HOOD) ×6 IMPLANT
IMMOBILIZER KNEE 20 (SOFTGOODS)
IMMOBILIZER KNEE 20 THIGH 36 (SOFTGOODS) IMPLANT
IMMOBILIZER KNEE 22 UNIV (SOFTGOODS) ×2 IMPLANT
KIT BASIN OR (CUSTOM PROCEDURE TRAY) ×2 IMPLANT
KIT ROOM TURNOVER OR (KITS) ×2 IMPLANT
MANIFOLD NEPTUNE II (INSTRUMENTS) ×2 IMPLANT
NEEDLE 22X1 1/2 (OR ONLY) (NEEDLE) ×2 IMPLANT
NEEDLE HYPO 25GX1X1/2 BEV (NEEDLE) ×2 IMPLANT
NS IRRIG 1000ML POUR BTL (IV SOLUTION) ×2 IMPLANT
PACK TOTAL JOINT (CUSTOM PROCEDURE TRAY) ×2 IMPLANT
PAD ARMBOARD 7.5X6 YLW CONV (MISCELLANEOUS) ×4 IMPLANT
PAD CAST 4YDX4 CTTN HI CHSV (CAST SUPPLIES) ×1 IMPLANT
PADDING CAST COTTON 4X4 STRL (CAST SUPPLIES) ×1
SET HNDPC FAN SPRY TIP SCT (DISPOSABLE) ×1 IMPLANT
SPONGE GAUZE 4X4 12PLY (GAUZE/BANDAGES/DRESSINGS) ×2 IMPLANT
STAPLER VISISTAT 35W (STAPLE) ×2 IMPLANT
STRIP CLOSURE SKIN 1/2X4 (GAUZE/BANDAGES/DRESSINGS) ×2 IMPLANT
SUCTION FRAZIER TIP 10 FR DISP (SUCTIONS) ×2 IMPLANT
SUT MNCRL AB 3-0 PS2 18 (SUTURE) ×2 IMPLANT
SUT VIC AB 0 CTB1 27 (SUTURE) ×4 IMPLANT
SUT VIC AB 1 CT1 27 (SUTURE) ×2
SUT VIC AB 1 CT1 27XBRD ANBCTR (SUTURE) ×2 IMPLANT
SUT VIC AB 2-0 CTB1 (SUTURE) ×4 IMPLANT
SYR CONTROL 10ML LL (SYRINGE) IMPLANT
TOWEL OR 17X24 6PK STRL BLUE (TOWEL DISPOSABLE) ×2 IMPLANT
TOWEL OR 17X26 10 PK STRL BLUE (TOWEL DISPOSABLE) ×2 IMPLANT
TRAY FOLEY CATH 16FRSI W/METER (SET/KITS/TRAYS/PACK) ×2 IMPLANT
WATER STERILE IRR 1000ML POUR (IV SOLUTION) ×4 IMPLANT

## 2013-03-18 NOTE — Anesthesia Postprocedure Evaluation (Signed)
Anesthesia Post Note  Patient: Scott Welch  Procedure(s) Performed: Procedure(s) (LRB): LEFT TOTAL KNEE ARTHROPLASTY (Left)  Anesthesia type: general  Patient location: PACU  Post pain: Pain level controlled  Post assessment: Patient's Cardiovascular Status Stable  Post vital signs: Reviewed and stable  Level of consciousness: sedated  Complications: No apparent anesthesia complications

## 2013-03-18 NOTE — Anesthesia Procedure Notes (Addendum)
Anesthesia Regional Block:  Femoral nerve block  Pre-Anesthetic Checklist: ,, timeout performed, Correct Patient, Correct Site, Correct Laterality, Correct Procedure, Correct Position, site marked, Risks and benefits discussed,  Surgical consent,  Pre-op evaluation,  At surgeon's request and post-op pain management  Laterality: Left  Prep: chloraprep       Needles:  Injection technique: Single-shot  Needle Type: Echogenic Stimulator Needle     Needle Length: 5cm 5 cm Needle Gauge: 22 and 22 G    Additional Needles:  Procedures: ultrasound guided (picture in chart) and nerve stimulator Femoral nerve block  Nerve Stimulator or Paresthesia:  Response: quadraceps contraction, 0.45 mA,   Additional Responses:   Narrative:  Start time: 03/18/2013 9:32 AM End time: 03/18/2013 9:42 AM Injection made incrementally with aspirations every 5 mL.  Performed by: Personally  Anesthesiologist: Halford Decamp, MD  Additional Notes: Functioning IV was confirmed and monitors were applied.  A 50mm 22ga Arrow echogenic stimulator needle was used. Sterile prep and drape,hand hygiene and sterile gloves were used. Ultrasound guidance: relevant anatomy identified, needle position confirmed, local anesthetic spread visualized around nerve(s)., vascular puncture avoided.  Image printed for medical record. Negative aspiration and negative test dose prior to incremental administration of local anesthetic. The patient tolerated the procedure well.     Procedure Name: LMA Insertion Date/Time: 03/18/2013 10:19 AM Performed by: Marena Chancy Pre-anesthesia Checklist: Patient identified, Patient being monitored, Emergency Drugs available, Timeout performed and Suction available Patient Re-evaluated:Patient Re-evaluated prior to inductionOxygen Delivery Method: Circle system utilized Preoxygenation: Pre-oxygenation with 100% oxygen Intubation Type: IV induction LMA: LMA inserted LMA Size:  5.0 Placement Confirmation: breath sounds checked- equal and bilateral and positive ETCO2 Tube secured with: Tape Dental Injury: Teeth and Oropharynx as per pre-operative assessment

## 2013-03-18 NOTE — H&P (Signed)
TOTAL KNEE ADMISSION H&P  Patient is being admitted for left total knee arthroplasty.  Subjective:  Chief Complaint:left knee pain.  HPI: Scott Welch, 67 y.o. male, has a history of pain and functional disability in the left knee due to arthritis and has failed non-surgical conservative treatments for greater than 12 weeks to includeNSAID's and/or analgesics, corticosteriod injections, viscosupplementation injections, flexibility and strengthening excercises, supervised PT with diminished ADL's post treatment, use of assistive devices, weight reduction as appropriate and activity modification.  Onset of symptoms was gradual, starting 3 years ago with gradually worsening course since that time. The patient noted prior procedures on the knee to include  arthroscopy and menisectomy on the left knee(s).  Patient currently rates pain in the left knee(s) at 8 out of 10 with activity. Patient has night pain, worsening of pain with activity and weight bearing, pain that interferes with activities of daily living, pain with passive range of motion, crepitus and joint swelling.  Patient has evidence of subchondral sclerosis and joint space narrowing by imaging studies. This patient has had failure of conservative care. There is no active infection.  Patient Active Problem List   Diagnosis Date Noted  . CAD (coronary artery disease), native coronary artery 02/22/2013    Class: Chronic  . Essential hypertension, benign 02/22/2013    Class: Chronic  . Hyperlipidemia 02/22/2013  . Obstructive sleep apnea 02/22/2013    Class: Chronic  . Osteoarthritis of left knee 02/22/2013    Class: Chronic   Past Medical History  Diagnosis Date  . Hypertension   . Depression   . Knee pain     follow[facility] by [facility] Luiz Welch at [facility]  . Coronary artery disease     one-vessel followed by Dr. Katrinka Welch  . OSA on CPAP     since 1995. BIPAP  . Obesity   . Erectile dysfunction   . Osteoarthritis     left knee, Dr.  Luiz Welch  . Allergic rhinitis   . Plantar fasciitis   . Cervical radiculopathy     2008  . Acne   . DJD (degenerative joint disease)     Scott Welch  . Ear congestion     ear tubes, Scott Welch  . Fractures     left elbow, right elbow, nose, multiple fingers, left clavicle    Past Surgical History  Procedure Laterality Date  . Coronary artery bypass graft      LIMA TO LAD  . Cardiac catheterization      drug-eluting stents in circumflev coronary artery, 2006  . Hernia repair    . Knee arthroscopy Bilateral     Prescriptions prior to admission  Medication Sig Dispense Refill  . aspirin 81 MG tablet Take 81 mg by mouth daily.      Marland Kitchen atorvastatin (LIPITOR) 20 MG tablet Take 20 mg by mouth daily.      . cetirizine (ZYRTEC) 10 MG tablet Take 10 mg by mouth daily.      . cholecalciferol (VITAMIN D) 1000 UNITS tablet Take 1,000 Units by mouth daily.      . diclofenac (VOLTAREN) 75 MG EC tablet Take 75 mg by mouth 2 (two) times daily.      Marland Kitchen escitalopram (LEXAPRO) 10 MG tablet Take 10 mg by mouth daily.      . fluticasone (FLONASE) 50 MCG/ACT nasal spray Place 2 sprays into the nose daily as needed for allergies.       . Ginkgo Biloba (GINKOBA) 40 MG TABS Take 40 mg  by mouth.      Marland Kitchen lisinopril (PRINIVIL,ZESTRIL) 20 MG tablet Take 20 mg by mouth daily.      . Misc Natural Products (OSTEO BI-FLEX TRIPLE STRENGTH PO) Take 1 tablet by mouth.       . Multiple Vitamin (MULTIVITAMIN) tablet Take 1 tablet by mouth daily.      . sildenafil (VIAGRA) 50 MG tablet Take 50 mg by mouth daily as needed for erectile dysfunction.       No Known Allergies  History  Substance Use Topics  . Smoking status: Never Smoker   . Smokeless tobacco: Not on file  . Alcohol Use: 8.4 oz/week    14 Glasses of wine per week    Family History  Problem Relation Age of Onset  . CAD Mother   . Dementia Mother   . CAD Father   . Hypertension Sister   . Diabetes Sister   . Gastric cancer Sister   . Glaucoma Brother       ROS ROS: I have reviewed the patient's review of systems thoroughly and there are no positive responses as relates to the HPI. Objective:  Physical Exam  Vital signs in last 24 hours: Temp:  [98 F (36.7 C)] 98 F (36.7 C) (12/05 0813) Pulse Rate:  [72] 72 (12/05 0813) Resp:  [18] 18 (12/05 0813) BP: (137)/(59) 137/59 mmHg (12/05 0813) SpO2:  [96 %] 96 % (12/05 0813) Well-developed well-nourished patient in no acute distress. Alert and oriented x3 HEENT:within normal limits Cardiac: Regular rate and rhythm Pulmonary: Lungs clear to auscultation Abdomen: Soft and nontender.  Normal active bowel sounds  Musculoskeletal:l knee: painful ROM TTP over lat jt line// 1=effusion Labs: Recent Results (from the past 2160 hour(s))  SURGICAL PCR SCREEN     Status: None   Collection Time    03/08/13  8:34 AM      Result Value Range   MRSA, PCR NEGATIVE  NEGATIVE   Staphylococcus aureus NEGATIVE  NEGATIVE   Comment:            The Xpert SA Assay (FDA     approved for NASAL specimens     in patients over 26 years of age),     is one component of     a comprehensive surveillance     program.  Test performance has     been validated by [employer] for patients greater     than or equal to 74 year old.     It is not intended     to diagnose infection nor to     guide or monitor treatment.  URINALYSIS, ROUTINE W REFLEX MICROSCOPIC     Status: None   Collection Time    03/08/13  8:34 AM      Result Value Range   Color, Urine YELLOW  YELLOW   APPearance CLEAR  CLEAR   Specific Gravity, Urine 1.022  1.005 - 1.030   pH 6.5  5.0 - 8.0   Glucose, UA NEGATIVE  NEGATIVE mg/dL   Hgb urine dipstick NEGATIVE  NEGATIVE   Bilirubin Urine NEGATIVE  NEGATIVE   Ketones, ur NEGATIVE  NEGATIVE mg/dL   Protein, ur NEGATIVE  NEGATIVE mg/dL   Urobilinogen, UA 0.2  0.0 - 1.0 mg/dL   Nitrite NEGATIVE  NEGATIVE   Leukocytes, UA NEGATIVE  NEGATIVE   Comment: MICROSCOPIC NOT DONE ON URINES WITH  NEGATIVE PROTEIN, BLOOD, LEUKOCYTES, NITRITE, OR GLUCOSE <1000 mg/dL.  TYPE AND  SCREEN     Status: None   Collection Time    03/08/13  8:37 AM      Result Value Range   ABO/RH(D) A POS     Antibody Screen NEG     Sample Expiration 03/22/2013    ABO/RH     Status: None   Collection Time    03/08/13  8:37 AM      Result Value Range   ABO/RH(D) A POS    APTT     Status: None   Collection Time    03/08/13  8:45 AM      Result Value Range   aPTT 27  24 - 37 seconds  CBC WITH DIFFERENTIAL     Status: Abnormal   Collection Time    03/08/13  8:45 AM      Result Value Range   WBC 5.1  4.0 - 10.5 K/uL   RBC 4.50  4.22 - 5.81 MIL/uL   Hemoglobin 14.4  13.0 - 17.0 g/dL   HCT 16.1  09.6 - 04.5 %   MCV 93.3  78.0 - 100.0 fL   MCH 32.0  26.0 - 34.0 pg   MCHC 34.3  30.0 - 36.0 g/dL   RDW 40.9  81.1 - 91.4 %   Platelets 165  150 - 400 K/uL   Neutrophils Relative % 59  43 - 77 %   Neutro Abs 3.0  1.7 - 7.7 K/uL   Lymphocytes Relative 26  12 - 46 %   Lymphs Abs 1.3  0.7 - 4.0 K/uL   Monocytes Relative 8  3 - 12 %   Monocytes Absolute 0.4  0.1 - 1.0 K/uL   Eosinophils Relative 6 (*) 0 - 5 %   Eosinophils Absolute 0.3  0.0 - 0.7 K/uL   Basophils Relative 1  0 - 1 %   Basophils Absolute 0.1  0.0 - 0.1 K/uL  COMPREHENSIVE METABOLIC PANEL     Status: Abnormal   Collection Time    03/08/13  8:45 AM      Result Value Range   Sodium 140  135 - 145 mEq/L   Potassium 4.6  3.5 - 5.1 mEq/L   Chloride 104  96 - 112 mEq/L   CO2 28  19 - 32 mEq/L   Glucose, Bld 111 (*) 70 - 99 mg/dL   BUN 31 (*) 6 - 23 mg/dL   Creatinine, Ser 7.82  0.50 - 1.35 mg/dL   Calcium 9.0  8.4 - 95.6 mg/dL   Total Protein 6.4  6.0 - 8.3 g/dL   Albumin 3.9  3.5 - 5.2 g/dL   AST 21  0 - 37 U/L   ALT 26  0 - 53 U/L   Alkaline Phosphatase 66  39 - 117 U/L   Total Bilirubin 0.5  0.3 - 1.2 mg/dL   GFR calc non Af Amer 72 (*) >90 mL/min   GFR calc Af Amer 84 (*) >90 mL/min   Comment: (NOTE)     The eGFR has been  calculated using the CKD EPI equation.     This calculation has not been validated in all clinical situations.     eGFR's persistently <90 mL/min signify possible Chronic Kidney     Disease.  PROTIME-INR     Status: None   Collection Time    03/08/13  8:45 AM      Result Value Range   Prothrombin Time 13.2  11.6 - 15.2 seconds   INR 1.02  0.00 - 1.49    There is no weight on file to calculate BMI.   Imaging Review Plain radiographs demonstrate severe degenerative joint disease of the left knee(s). The overall alignment ismild valgus. The bone quality appears to be good for age and reported activity level.  Assessment/Plan:  End stage arthritis, left knee   The patient history, physical examination, clinical judgment of the provider and imaging studies are consistent with end stage degenerative joint disease of the left knee(s) and total knee arthroplasty is deemed medically necessary. The treatment options including medical management, injection therapy arthroscopy and arthroplasty were discussed at length. The risks and benefits of total knee arthroplasty were presented and reviewed. The risks due to aseptic loosening, infection, stiffness, patella tracking problems, thromboembolic complications and other imponderables were discussed. The patient acknowledged the explanation, agreed to proceed with the plan and consent was signed. Patient is being admitted for inpatient treatment for surgery, pain control, PT, OT, prophylactic antibiotics, VTE prophylaxis, progressive ambulation and ADL's and discharge planning. The patient is planning to be discharged home with home health services

## 2013-03-18 NOTE — Progress Notes (Signed)
Utilization review completed.  

## 2013-03-18 NOTE — Evaluation (Signed)
Physical Therapy Evaluation Patient Details Name: Scott Welch MRN: [id number] DOB: 02/23/1946 Today's Date: 03/18/2013 Time: 9147-8295 PT Time Calculation (min): 27 min  PT Assessment / Plan / Recommendation History of Present Illness  Pt is a 67 y/o male admitted s/p L TKA on 03/18/2013.  Clinical Impression  This patient presents with acute pain and decreased functional independence following the above mentioned procedure. At the time of PT eval, pt was able to perform functional mobility and ambulation with supervision or min guard. This patient is appropriate for skilled PT interventions to address functional limitations, improve safety and independence with functional mobility, and return to PLOF.     PT Assessment  Patient needs continued PT services    Follow Up Recommendations  Home health PT    Does the patient have the potential to tolerate intense rehabilitation      Barriers to Discharge        Equipment Recommendations       Recommendations for Other Services     Frequency 7X/week    Precautions / Restrictions Precautions Precautions: Fall;Knee Precaution Comments: Discussed towel roll under heel and no pillows under knee Required Braces or Orthoses: Knee Immobilizer - Left Knee Immobilizer - Left: On when out of bed or walking Restrictions Weight Bearing Restrictions: Yes LLE Weight Bearing: Weight bearing as tolerated   Pertinent Vitals/Pain Pt reports 6/10 pain while in CPM      Mobility  Bed Mobility Bed Mobility: Supine to Sit;Sitting - Scoot to Edge of Bed Supine to Sit: 5: Supervision;HOB flat;With rails Sitting - Scoot to Edge of Bed: 5: Supervision Details for Bed Mobility Assistance: VC's for sequencing and safety awareness. Pt did not require any physical assist to transition to EOB. Transfers Transfers: Sit to Stand;Stand to Sit Sit to Stand: 4: Min guard;From bed;With upper extremity assist Stand to Sit: 4: Min guard;To  chair/3-in-1;With upper extremity assist Details for Transfer Assistance: VC's for hand placement on seated surface. Pt did not require any physical assist for transfers.  Ambulation/Gait Ambulation/Gait Assistance: 4: Min guard Ambulation Distance (Feet): 30 Feet Assistive device: Rolling walker Ambulation/Gait Assistance Details: VC's for sequencing and safety awareness, as well as increased heel strike and increased step/stride length on R to encourage step-through gait pattern.  Gait Pattern: Step-through pattern;Decreased stride length Gait velocity: Decreased Stairs: No    Exercises Total Joint Exercises Ankle Circles/Pumps: 10 reps Quad Sets: 10 reps   PT Diagnosis: Difficulty walking;Acute pain  PT Problem List: Decreased strength;Decreased range of motion;Decreased activity tolerance;Decreased balance;Decreased mobility;Decreased knowledge of use of DME;Decreased safety awareness;Pain;Decreased knowledge of precautions PT Treatment Interventions: DME instruction;Gait training;Stair training;Functional mobility training;Therapeutic activities;Therapeutic exercise;Neuromuscular re-education;Patient/family education     PT Goals(Current goals can be found in the care plan section) Acute Rehab PT Goals Patient Stated Goal: To return home with wife PT Goal Formulation: With patient/family Time For Goal Achievement: 03/25/13 Potential to Achieve Goals: Good  Visit Information  Last PT Received On: 03/18/13 Assistance Needed: +1 History of Present Illness: Pt is a 67 y/o male admitted s/p L TKA on 03/18/2013.       Prior Functioning  Home Living Family/patient expects to be discharged to:: Private residence Living Arrangements: Spouse/significant other Available Help at Discharge: Family;Available 24 hours/day Type of Home: House Home Access: Stairs to enter [employer] of Steps: 6 Entrance Stairs-Rails: Right;Left Home Layout: Two level;Able to live on main  level with bedroom/bathroom Alternate Level Stairs-Number of Steps: 15 Alternate Level Stairs-Rails: Can reach both  Home Equipment: Walker - 2 wheels;Cane - single point;Bedside commode;Crutches Prior Function Level of Independence: Independent Communication Communication: No difficulties Dominant Hand: Right    Cognition  Cognition Arousal/Alertness: Awake/alert Behavior During Therapy: WFL for tasks assessed/performed Overall Cognitive Status: Within Functional Limits for tasks assessed    Extremity/Trunk Assessment Upper Extremity Assessment Upper Extremity Assessment: Overall WFL for tasks assessed Lower Extremity Assessment Lower Extremity Assessment: Overall WFL for tasks assessed;RLE deficits/detail RLE Deficits / Details: Decreased strength and AROM consistent with L TKA RLE: Unable to fully assess due to pain Cervical / Trunk Assessment Cervical / Trunk Assessment: Normal   Balance    End of Session PT - End of Session Equipment Utilized During Treatment: Gait belt;Left knee immobilizer Activity Tolerance: Patient tolerated treatment well Patient left: in chair;with call bell/phone within reach;with family/visitor present Nurse Communication: Mobility status CPM Left Knee CPM Left Knee: Off Left Knee Flexion (Degrees): 60 Left Knee Extension (Degrees): 0 Additional Comments: Trapeze  GP     [name] 03/18/2013, 4:43 PM  [name], PT, DPT [phone]

## 2013-03-18 NOTE — Brief Op Note (Signed)
03/18/2013  12:07 PM  PATIENT:  Scott Welch  67 y.o. male  PRE-OPERATIVE DIAGNOSIS:  BILATERAL DEGENERATIVE JOINT DISEASE  POST-OPERATIVE DIAGNOSIS:  BILATERAL DEGENERATIVE JOINT DISEASE  PROCEDURE:  Procedure(s): LEFT TOTAL KNEE ARTHROPLASTY (Left)  SURGEON:  Surgeon(s) and Role:    * Harvie Junior, MD - Primary  PHYSICIAN ASSISTANT:   ASSISTANTS: betrhune   ANESTHESIA:   general  EBL:  Total I/O In: 1000 [I.V.:1000] Out: -   BLOOD ADMINISTERED:none  DRAINS: (1) Hemovact drain(s) in the l knee with  Suction Open   LOCAL MEDICATIONS USED:  MARCAINE    and OTHER experel  SPECIMEN:  No Specimen  DISPOSITION OF SPECIMEN:  N/A  COUNTS:  YES  TOURNIQUET:   Total Tourniquet Time Documented: Thigh (Left) - 73 minutes Total: Thigh (Left) - 73 minutes   DICTATION: .Other Dictation: Dictation Number [id number]  PLAN OF CARE: Admit to inpatient   PATIENT DISPOSITION:  PACU - hemodynamically stable.   Delay start of Pharmacological VTE agent (>24hrs) due to surgical blood loss or risk of bleeding: no

## 2013-03-18 NOTE — Progress Notes (Signed)
Orthopedic Tech Progress Note Patient Details:  Scott Welch [birth year] [id number]  CPM Left Knee CPM Left Knee: On Left Knee Flexion (Degrees): 60 Left Knee Extension (Degrees): 0 Additional Comments: Trapeze bar and foot roll   Cammer, Mickie Bail 03/18/2013, 1:32 PM

## 2013-03-18 NOTE — Preoperative (Signed)
Beta Blockers   Reason not to administer Beta Blockers:Not Applicable 

## 2013-03-18 NOTE — Anesthesia Preprocedure Evaluation (Signed)
Anesthesia Evaluation  Patient identified by MRN, date of birth, ID band Patient awake    Reviewed: Allergy & Precautions, H&P , NPO status , Patient's Chart, lab work & pertinent test results  History of Anesthesia Complications (+) history of anesthetic complications  Airway       Dental   Pulmonary sleep apnea and Continuous Positive Airway Pressure Ventilation ,          Cardiovascular hypertension, Pt. on medications + CAD     Neuro/Psych PSYCHIATRIC DISORDERS Depression negative neurological ROS     GI/Hepatic negative GI ROS, Neg liver ROS,   Endo/Other  negative endocrine ROS  Renal/GU negative Renal ROS     Musculoskeletal   Abdominal   Peds  Hematology negative hematology ROS (+)   Anesthesia Other Findings   Reproductive/Obstetrics                           Anesthesia Physical Anesthesia Plan  ASA: III  Anesthesia Plan: General   Post-op Pain Management:    Induction: Intravenous  Airway Management Planned: LMA  Additional Equipment:   Intra-op Plan:   Post-operative Plan: Extubation in OR  Informed Consent: I have reviewed the patients History and Physical, chart, labs and discussed the procedure including the risks, benefits and alternatives for the proposed anesthesia with the patient or authorized representative who has indicated his/her understanding and acceptance.   Dental advisory given  Plan Discussed with: CRNA, Anesthesiologist and Surgeon  Anesthesia Plan Comments:         Anesthesia Quick Evaluation

## 2013-03-18 NOTE — Transfer of Care (Signed)
Immediate Anesthesia Transfer of Care Note  Patient: Scott Welch  Procedure(s) Performed: Procedure(s): LEFT TOTAL KNEE ARTHROPLASTY (Left)  Patient Location: PACU  Anesthesia Type:General  Level of Consciousness: awake, alert  and oriented  Airway & Oxygen Therapy: Patient Spontanous Breathing and Patient connected to nasal cannula oxygen  Post-op Assessment: Report given to PACU RN and Post -op Vital signs reviewed and stable  Post vital signs: Reviewed and stable  Complications: No apparent anesthesia complications

## 2013-03-19 LAB — CBC
HCT: 36.9 % — ABNORMAL LOW (ref 39.0–52.0)
Hemoglobin: 12.6 g/dL — ABNORMAL LOW (ref 13.0–17.0)
MCH: 31.5 pg (ref 26.0–34.0)
MCHC: 34.1 g/dL (ref 30.0–36.0)
MCV: 92.3 fL (ref 78.0–100.0)
RBC: 4 MIL/uL — ABNORMAL LOW (ref 4.22–5.81)
WBC: 11.4 10*3/uL — ABNORMAL HIGH (ref 4.0–10.5)

## 2013-03-19 LAB — BASIC METABOLIC PANEL
BUN: 22 mg/dL (ref 6–23)
CO2: 24 mEq/L (ref 19–32)
Calcium: 8.3 mg/dL — ABNORMAL LOW (ref 8.4–10.5)
Chloride: 103 mEq/L (ref 96–112)
GFR calc non Af Amer: 89 mL/min — ABNORMAL LOW (ref >90)
Glucose, Bld: 179 mg/dL — ABNORMAL HIGH (ref 70–99)
Potassium: 4.4 mEq/L (ref 3.5–5.1)

## 2013-03-19 NOTE — Progress Notes (Signed)
Patient setup and placed on home CPAP Unit checked out ok. No issues, brand new unit and mask. Tolerating well. Wil continue to monitor.

## 2013-03-19 NOTE — Progress Notes (Signed)
Physical Therapy Treatment Patient Details Name: Scott Welch MRN: [id number] DOB: June 14, 1945 Today's Date: 03/19/2013 Time: 4098-1191 PT Time Calculation (min): 23 min  PT Assessment / Plan / Recommendation  History of Present Illness Pt is a 67 y/o male admitted s/p L TKA on 03/18/2013.   PT Comments   Patient progressing very well with overall mobility. Patient able to increase therex and hall ambulation. May attempt steps next session.   Follow Up Recommendations  Home health PT     Does the patient have the potential to tolerate intense rehabilitation     Barriers to Discharge        Equipment Recommendations       Recommendations for Other Services    Frequency 7X/week   Progress towards PT Goals Progress towards PT goals: Progressing toward goals  Plan Current plan remains appropriate    Precautions / Restrictions Precautions Precautions: Fall;Knee Required Braces or Orthoses: Knee Immobilizer - Left Knee Immobilizer - Left: On when out of bed or walking Restrictions LLE Weight Bearing: Weight bearing as tolerated   Pertinent Vitals/Pain 3/10 L knee pain with LAQs otherwise no pain   Mobility  Bed Mobility Bed Mobility: Not assessed Transfers Sit to Stand: 5: Supervision;From chair/3-in-1 Stand to Sit: 5: Supervision;To chair/3-in-1 Ambulation/Gait Ambulation/Gait Assistance: 5: Supervision Ambulation Distance (Feet): 300 Feet Assistive device: Rolling walker Ambulation/Gait Assistance Details: Cues to not lean over front of RW and for upright posture Gait Pattern: Step-through pattern;Decreased stride length    Exercises Total Joint Exercises Quad Sets: AROM;Left;10 reps Heel Slides: AROM;Left;10 reps Hip ABduction/ADduction: AROM;Left;10 reps Straight Leg Raises: AROM;Left;10 reps Long Arc Quad: AROM;Left;10 reps   PT Diagnosis:    PT Problem List:   PT Treatment Interventions:     PT Goals (current goals can now be found in the care plan  section)    Visit Information  Last PT Received On: 03/19/13 Assistance Needed: +1 History of Present Illness: Pt is a 67 y/o male admitted s/p L TKA on 03/18/2013.    Subjective Data      Cognition  Cognition Arousal/Alertness: Awake/alert Behavior During Therapy: WFL for tasks assessed/performed Overall Cognitive Status: Within Functional Limits for tasks assessed    Balance     End of Session PT - End of Session Equipment Utilized During Treatment: Gait belt;Left knee immobilizer Activity Tolerance: Patient tolerated treatment well Patient left: in chair;with call bell/phone within reach;with family/visitor present Nurse Communication: Mobility status CPM Left Knee CPM Left Knee: On Left Knee Flexion (Degrees): 60 Left Knee Extension (Degrees): 0   GP     Fredrich Birks 03/19/2013, 8:44 AM 03/19/2013 Fredrich Birks PTA [phone] pager [phone] office

## 2013-03-19 NOTE — Op Note (Signed)
Scott Welch, Scott Welch NO.:  [id number]  MEDICAL RECORD NO.:  [id number]  LOCATION:  [id number]                        FACILITY:  [facility]  PHYSICIAN:  Harvie Junior, M.D.   DATE OF BIRTH:  11-15-1945  DATE OF PROCEDURE:  03/18/2013 DATE OF DISCHARGE:                              OPERATIVE REPORT   PREOPERATIVE DIAGNOSIS:  End-stage degenerative joint disease, left knee.  POSTOPERATIVE DIAGNOSIS:  End-stage degenerative joint disease, left knee.  PROCEDURE:  Left total knee replacement with a Sigma system, size 6 femur, size 6 tibia, 10-mm bridging bearing, and a 41-mm all polyethylene patella.  SURGEON:  Harvie Junior, MD  ASSISTANT:  Marshia Ly, PA  ANESTHESIA:  General.  BRIEF HISTORY:  Scott Welch is a 67 year old male with a history of having significant complaints of left knee pain.  He had been treated with arthroscopy, activity modification, weight loss, physical therapy, injection therapy, failed all of these.  Had night pain, had light activity pain.  Because of failure of all conservative care and x-ray showing bone-on-bone changes, he was taken to the operating room for left total knee replacement.  DESCRIPTION OF PROCEDURE:  The patient was taken to the operating room. After adequate anesthesia was obtained with general anesthetic, the patient was placed supine on the operating table.  Left leg was prepped and draped in usual sterile fashion.  Following this, the leg was exsanguinated.  Blood pressure tourniquet inflated to 350 mmHg. Following this, a midline incision was made in the subcutaneous tissue down to the level of the extensor mechanism.  Medial parapatellar arthrotomy was undertaken.  Medial and lateral menisci were removed, retropatellar fat pad, synovium in the anterior aspect of the femur, and the anterior and posterior cruciates.  Once this was done, the tibia was cut perpendicular to its long axis with an extramedullary guide.   Femur was cut with an intramedullary guide with 5-degree valgus alignment. The spacer block was put in place after the distal cuts were made both and the spacer block comes into full extension.  At this point, attention was turned to the femur, sized to a 6.  Its anterior and posterior cuts were made, chamfers and box.  Tibia sized to a 6, its drilled and keeled, and trials were put in place, little bit tight in extension, but we worked around a little bit with soft tissue release and got the easy nice full extension.  At this point, attention was turned to the patella, it is cut down to a level of 14 mm and a 41 paddle was chosen.  Lugs were drilled  in the femur.  Knee was put through a range of motion.  Stability is tested, excellent full extension, full flexion.  No significant laxity in the mid range.  At this point, the trial components were removed.  The knee was then copiously and thoroughly lavaged, suctioned, dried, and the final components cemented into place.  Size 6 femur, size 6 tibia, 10-mm bridging trial was placed, and a 41 all poly patella was placed, and held with a clamp.  All excess bone cement was removed and the cement was allowed to completely harden.  Once the cement was completely hardened, the attention was turned towards letting the tourniquet down and all bleeding were controlled with electrocautery.  Exparel 20 mL which is mixed with 40 mL of saline is now introduced into the posterior aspect of the knee, both in the lateral and medial side, it was then advanced around the medial and lateral side of the knee and the midportion.  It was then advanced into the synovial tissue and the reflection on the undersurface of the knee.  At this point, medium Hemovac drain was placed.  A final 10 poly was opened and placed and again stability was checked.  Range of motion was excellent.  Stability was excellent.  The medial patellar arthrotomy was closed with 1  Vicryl running, and at this point, a 20 mL of additional Exparel was placed into the subcutaneous tissues and then again around the medial and lateral joint line.  Once this was done, the final layers are closed with 0 and 2-0 Vicryl and Monocryl subcuticular.  Benzoin and Steri- Strips were applied.  Sterile compressive dressing was applied.  The patient was taken to the recovery room, he was noted to be in satisfactory condition.  Estimated blood loss for the procedure is minimal.     Harvie Junior, M.D.     Ranae Plumber  D:  03/18/2013  T:  03/19/2013  Job:  [id number]

## 2013-03-19 NOTE — Progress Notes (Signed)
Subjective: 1 Day Post-Op Procedure(s) (LRB): LEFT TOTAL KNEE ARTHROPLASTY (Left) Patient reports pain as 2 on 0-10 scale.   Up ambulating with physical therapy.  Objective: Vital signs in last 24 hours: Temp:  [97.2 F (36.2 C)-98.8 F (37.1 C)] 97.2 F (36.2 C) (12/06 0605) Pulse Rate:  [39-85] 77 (12/06 0605) Resp:  [14-18] 18 (12/06 0605) BP: (144-174)/(68-86) 146/78 mmHg (12/06 0605) SpO2:  [93 %-96 %] 95 % (12/06 0605)  Intake/Output from previous day: 12/05 0701 - 12/06 0700 In: 3488.3 [P.O.:480; I.V.:3008.3] Out: 1675 [Urine:1225; Drains:450] Intake/Output this shift: Total I/O In: 360 [P.O.:360] Out: -    Recent Labs  03/19/13 0359  HGB 12.6*    Recent Labs  03/19/13 0359  WBC 11.4*  RBC 4.00*  HCT 36.9*  PLT 161    Recent Labs  03/19/13 0359  NA 137  K 4.4  CL 103  CO2 24  BUN 22  CREATININE 0.80  GLUCOSE 179*  CALCIUM 8.3*  left knee exam: Neurovascular intact Sensation intact distally Intact pulses distally Dorsiflexion/Plantar flexion intact Incision: dressing C/D/I Compartment soft  Assessment/Plan: 1 Day Post-Op Procedure(s) (LRB): LEFT TOTAL KNEE ARTHROPLASTY (Left) Plan: Drain pulled Up with therapy Discharge home with home health tomorrow. Continue aspirin 325 mg twice daily with SCDs for DVT prophylaxis.  Kamyrah Feeser G 03/19/2013, 9:40 AM

## 2013-03-19 NOTE — Progress Notes (Signed)
Pt unable to void at 2145. Bladder scan resulted 170. Pt was very uncomfortable, received order for in and out catheter from Gus Puma, [place]. However pt spontaneously voided 600 prior to insertion, catheter not inserted. Pt stated he felt full relief. Will continue to monitor.

## 2013-03-19 NOTE — Progress Notes (Signed)
Physical Therapy Treatment Patient Details Name: DELORIS MITTAG MRN: [id number] DOB: 1945-04-30 Today's Date: 03/19/2013 Time: 4098-1191 PT Time Calculation (min): 19 min  PT Assessment / Plan / Recommendation  History of Present Illness Pt is a 67 y/o male admitted s/p L TKA on 03/18/2013.   PT Comments   Patient continues to make outstanding progress. Able to tolerate stair training well and complete full flight. Anticipate DC after one PT session again in AM to ensure safety with steps  Follow Up Recommendations  Home health PT     Does the patient have the potential to tolerate intense rehabilitation     Barriers to Discharge        Equipment Recommendations       Recommendations for Other Services    Frequency 7X/week   Progress towards PT Goals Progress towards PT goals: Progressing toward goals  Plan Current plan remains appropriate    Precautions / Restrictions Precautions Precautions: Fall;Knee Required Braces or Orthoses: Knee Immobilizer - Left Knee Immobilizer - Left: On when out of bed or walking Restrictions LLE Weight Bearing: Weight bearing as tolerated   Pertinent Vitals/Pain 2/10 tightness lateral side of L leg. No intervention needed   Mobility  Bed Mobility Bed Mobility: Sit to Supine Supine to Sit: 6: Modified independent (Device/Increase time) Sit to Supine: 6: Modified independent (Device/Increase time) Transfers Sit to Stand: 5: Supervision;From bed Stand to Sit: 5: Supervision;To bed Ambulation/Gait Ambulation/Gait Assistance: 5: Supervision Ambulation Distance (Feet): 400 Feet Assistive device: Rolling walker Ambulation/Gait Assistance Details: Cues for upright posture. Ambulated without KI with no evidence of balance or instability Gait Pattern: Step-through pattern;Decreased stride length Gait velocity: increasing Stairs: Yes Stairs Assistance: 4: Min guard Stairs Assistance Details (indicate cue type and reason): CUes for sequency and  technique. No physical assist needed for balance Stair Management Technique: Step to pattern;Forwards;One rail Left Number of Stairs: 12    Exercises     PT Diagnosis:    PT Problem List:   PT Treatment Interventions:     PT Goals (current goals can now be found in the care plan section)    Visit Information  Last PT Received On: 03/19/13 Assistance Needed: +1 History of Present Illness: Pt is a 67 y/o male admitted s/p L TKA on 03/18/2013.    Subjective Data      Cognition  Cognition Arousal/Alertness: Awake/alert Behavior During Therapy: WFL for tasks assessed/performed Overall Cognitive Status: Within Functional Limits for tasks assessed    Balance     End of Session PT - End of Session Equipment Utilized During Treatment: Gait belt Activity Tolerance: Patient tolerated treatment well Patient left: in CPM;in bed Nurse Communication: Mobility status CPM Left Knee CPM Left Knee: On Left Knee Flexion (Degrees): 65   GP     Robinette, Adline Potter 03/19/2013, 1:23 PM  03/19/2013 Fredrich Birks PTA [phone] pager [phone] office

## 2013-03-19 NOTE — Progress Notes (Signed)
   CARE MANAGEMENT NOTE 03/19/2013  Patient:  Scott Welch, Scott Welch   Account Number:  [id number]  Date Initiated:  03/19/2013  Documentation initiated by:  [facility]  Subjective/Objective Assessment:   adm: BILATERAL DEGENERATIVE JOINT DISEASE  LEFT TOTAL KNEE ARTHROPLASTY     Action/Plan:   discharge planning   Anticipated DC Date:  03/20/2013   Anticipated DC Plan:  HOME W HOME HEALTH SERVICES      DC Planning Services  CM consult      [facility] Choice  HOME HEALTH   Choice offered to / List presented to:  C-1 Patient   DME arranged  CPM      DME agency  [employer]        [facility] agency  [facility]   Status of service:  Completed, signed off Medicare Important Message given?   (If response is "NO", the following Medicare IM given date fields will be blank) Date Medicare IM given:   Date Additional Medicare IM given:    Discharge Disposition:  HOME W HOME HEALTH SERVICES  Per UR Regulation:    If discussed at Long Length of Stay Meetings, dates discussed:    Comments:  03/19/13 15:00 CM spoke with pt in room who presented letter stating [facility] would be providing "home care." Arrangements made prior to surgery in Dr. Luiz Blare office. TnT to set up CPM; no other CM needs were communicated. Freddy Jaksch, BSN, CM [phone].

## 2013-03-20 LAB — CBC
HCT: 34.4 % — ABNORMAL LOW (ref 39.0–52.0)
MCH: 31.3 pg (ref 26.0–34.0)
MCHC: 33.4 g/dL (ref 30.0–36.0)
MCV: 93.7 fL (ref 78.0–100.0)
Platelets: 163 10*3/uL (ref 150–400)
RBC: 3.67 MIL/uL — ABNORMAL LOW (ref 4.22–5.81)

## 2013-03-20 MED ORDER — ASPIRIN 325 MG PO TBEC: 325.0000 mg | DELAYED_RELEASE_TABLET | Freq: Two times a day (BID) | ORAL | Status: DC

## 2013-03-20 MED ORDER — METHOCARBAMOL 500 MG PO TABS: 500.0000 mg | ORAL_TABLET | Freq: Four times a day (QID) | ORAL | Status: DC | PRN

## 2013-03-20 MED ORDER — OXYCODONE-ACETAMINOPHEN 5-325 MG PO TABS: 1.0000 | ORAL_TABLET | Freq: Four times a day (QID) | ORAL | Status: DC | PRN

## 2013-03-20 NOTE — Progress Notes (Signed)
Physical Therapy Treatment Patient Details Name: Scott Welch MRN: [id number] DOB: 1945/07/10 Today's Date: 03/20/2013 Time: 0851-0910 PT Time Calculation (min): 19 min  PT Assessment / Plan / Recommendation  History of Present Illness Pt is a 67 y/o male admitted s/p L TKA on 03/18/2013.   PT Comments   Pt moving very well.  Safe to d/c home from mobility standpoint when MD feels medically ready.    Follow Up Recommendations  Home health PT     Does the patient have the potential to tolerate intense rehabilitation     Barriers to Discharge        Equipment Recommendations       Recommendations for Other Services    Frequency 7X/week   Progress towards PT Goals Progress towards PT goals: Progressing toward goals  Plan Current plan remains appropriate    Precautions / Restrictions Precautions Precautions: Fall;Knee Required Braces or Orthoses: Knee Immobilizer - Left Knee Immobilizer - Left: On when out of bed or walking Restrictions LLE Weight Bearing: Weight bearing as tolerated   Pertinent Vitals/Pain 4/10 Lt knee.  Repositioned for comfort.      Mobility  Bed Mobility Bed Mobility: Supine to Sit;Sitting - Scoot to Edge of Bed Supine to Sit: 6: Modified independent (Device/Increase time);HOB flat Sitting - Scoot to Edge of Bed: 6: Modified independent (Device/Increase time) Transfers Transfers: Sit to Stand;Stand to Sit Sit to Stand: 6: Modified independent (Device/Increase time);With upper extremity assist;From bed Stand to Sit: 6: Modified independent (Device/Increase time);With upper extremity assist;With armrests;To chair/3-in-1 Ambulation/Gait Ambulation/Gait Assistance: 5: Supervision Ambulation Distance (Feet): 350 Feet Assistive device: Rolling walker Ambulation/Gait Assistance Details: Cues for increased heel strike & terminal knee extension LLE.   Gait Pattern: Step-through pattern Stairs: No    Exercises Total Joint Exercises Ankle Circles/Pumps:  AROM;Both;15 reps Quad Sets: AROM;Strengthening;Both;10 reps Long Arc Quad: AROM;Strengthening;Left;Other reps (comment) (12 reps) Knee Flexion: AAROM;Left;10 reps;Seated (self AAROM)     PT Goals (current goals can now be found in the care plan section) Acute Rehab PT Goals PT Goal Formulation: With patient/family Time For Goal Achievement: 03/25/13 Potential to Achieve Goals: Good  Visit Information  Last PT Received On: 03/20/13 Assistance Needed: +1 History of Present Illness: Pt is a 67 y/o male admitted s/p L TKA on 03/18/2013.    Subjective Data      Cognition  Cognition Arousal/Alertness: Awake/alert Behavior During Therapy: WFL for tasks assessed/performed Overall Cognitive Status: Within Functional Limits for tasks assessed    Balance     End of Session PT - End of Session Activity Tolerance: Patient tolerated treatment well Patient left: in chair;with call bell/phone within reach Nurse Communication: Mobility status   GP     Scott Welch 03/20/2013, 9:15 AM   Scott Welch, PTA [phone] 03/20/2013

## 2013-03-20 NOTE — Progress Notes (Signed)
Subjective: 2 Days Post-Op Procedure(s) (LRB): LEFT TOTAL KNEE ARTHROPLASTY (Left) Patient reports pain as 2 on 0-10 scale.   Doing well. Good progress with PT.  Objective: Vital signs in last 24 hours: Temp:  [97.9 F (36.6 C)-99.1 F (37.3 C)] 98.7 F (37.1 C) (12/07 0641) Pulse Rate:  [85-97] 97 (12/07 0641) Resp:  [16-18] 16 (12/07 0641) BP: (129-156)/(68-90) 129/90 mmHg (12/07 0641) SpO2:  [96 %-100 %] 100 % (12/07 0641)  Intake/Output from previous day: 12/06 0701 - 12/07 0700 In: 1135 [P.O.:840; I.V.:295] Out: -  Intake/Output this shift: Total I/O In: 360 [P.O.:360] Out: -    Recent Labs  03/19/13 0359 03/20/13 0300  HGB 12.6* 11.5*    Recent Labs  03/19/13 0359 03/20/13 0300  WBC 11.4* 12.3*  RBC 4.00* 3.67*  HCT 36.9* 34.4*  PLT 161 163    Recent Labs  03/19/13 0359  NA 137  K 4.4  CL 103  CO2 24  BUN 22  CREATININE 0.80  GLUCOSE 179*  CALCIUM 8.3*  Left knee exam: Neurovascular intact Sensation intact distally Intact pulses distally Dorsiflexion/Plantar flexion intact Incision: dressing C/D/I Compartment soft  Assessment/Plan: 2 Days Post-Op Procedure(s) (LRB): LEFT TOTAL KNEE ARTHROPLASTY (Left) Plan: Dressing changed. Discharge home with home health  Shafin Pollio G 03/20/2013, 10:49 AM

## 2013-03-20 NOTE — Discharge Summary (Signed)
Patient ID: Scott Welch MRN: [id number] DOB/AGE: [birth year] 69 y.o.  Admit date: 03/18/2013 Discharge date: 03/20/2013  Admission Diagnoses:  Principal Problem:   Osteoarthritis of left knee   Discharge Diagnoses:  Same  Past Medical History  Diagnosis Date  . Hypertension   . Depression   . Knee pain     followed by Dr. Luiz Blare at [facility]  . Coronary artery disease     one-vessel followed by Dr. Katrinka Blazing  . OSA on CPAP     since 1995. BIPAP  . Obesity   . Erectile dysfunction   . Osteoarthritis     left knee, Dr. Luiz Blare  . Allergic rhinitis   . Plantar fasciitis   . Cervical radiculopathy     2008  . Acne   . DJD (degenerative joint disease)     [facility]  . Ear congestion     ear tubes, [facility]  . Fractures     left elbow, right elbow, nose, multiple fingers, left clavicle    Surgeries: Procedure(s): LEFT TOTAL KNEE ARTHROPLASTY on 03/18/2013   Discharged Condition: Improved  Hospital Course: Scott Welch is an 67 y.o. male who was admitted 03/18/2013 for operative treatment ofOsteoarthritis of left knee. Patient has severe unremitting pain that affects sleep, daily activities, and work/hobbies. After pre-op clearance the patient was taken to the operating room on 03/18/2013 and underwent  Procedure(s): LEFT TOTAL KNEE ARTHROPLASTY.    Patient was given perioperative antibiotics: Anti-infectives   Start     Dose/Rate Route Frequency Ordered Stop   03/18/13 1615  ceFAZolin (ANCEF) IVPB 2 g/50 mL premix     2 g 100 mL/hr over 30 Minutes Intravenous Every 6 hours 03/18/13 1405 03/18/13 2204   03/18/13 0600  ceFAZolin (ANCEF) IVPB 2 g/50 mL premix     2 g 100 mL/hr over 30 Minutes Intravenous On call to O.R. 03/17/13 1458 03/18/13 1015       Patient was given sequential compression devices, early ambulation, and chemoprophylaxis to prevent DVT.  Patient benefited maximally from hospital stay and there were no complications.    Recent vital signs:  Patient Vitals for the past 24 hrs:  BP Temp Temp src Pulse Resp SpO2  03/20/13 0641 129/90 mmHg 98.7 F (37.1 C) Axillary 97 16 100 %  03/19/13 2033 156/80 mmHg 97.9 F (36.6 C) Oral 85 16 96 %  03/19/13 1414 135/68 mmHg 99.1 F (37.3 C) - 94 18 97 %     Recent laboratory studies:  Recent Labs  03/19/13 0359 03/20/13 0300  WBC 11.4* 12.3*  HGB 12.6* 11.5*  HCT 36.9* 34.4*  PLT 161 163  NA 137  --   K 4.4  --   CL 103  --   CO2 24  --   BUN 22  --   CREATININE 0.80  --   GLUCOSE 179*  --   CALCIUM 8.3*  --      Discharge Medications:     Medication List    STOP taking these medications       aspirin 81 MG tablet  Replaced by:  aspirin 325 MG EC tablet     diclofenac 75 MG EC tablet  Commonly known as:  VOLTAREN      TAKE these medications       aspirin 325 MG EC tablet  Take 1 tablet (325 mg total) by mouth 2 (two) times daily after a meal.     atorvastatin 20 MG tablet  Commonly  known as:  LIPITOR  Take 20 mg by mouth daily.     cetirizine 10 MG tablet  Commonly known as:  ZYRTEC  Take 10 mg by mouth daily.     cholecalciferol 1000 UNITS tablet  Commonly known as:  VITAMIN D  Take 1,000 Units by mouth daily.     escitalopram 10 MG tablet  Commonly known as:  LEXAPRO  Take 10 mg by mouth daily.     fluticasone 50 MCG/ACT nasal spray  Commonly known as:  FLONASE  Place 2 sprays into the nose daily as needed for allergies.     GINKOBA 40 MG Tabs  Generic drug:  Ginkgo Biloba  Take 40 mg by mouth.     lisinopril 20 MG tablet  Commonly known as:  PRINIVIL,ZESTRIL  Take 20 mg by mouth daily.     methocarbamol 500 MG tablet  Commonly known as:  ROBAXIN  Take 1 tablet (500 mg total) by mouth every 6 (six) hours as needed for muscle spasms.     multivitamin tablet  Take 1 tablet by mouth daily.     OSTEO BI-FLEX TRIPLE STRENGTH PO  Take 1 tablet by mouth.     oxyCODONE-acetaminophen 5-325 MG per tablet  Commonly known as:   PERCOCET/ROXICET  Take 1-2 tablets by mouth every 6 (six) hours as needed for moderate pain.     sildenafil 50 MG tablet  Commonly known as:  VIAGRA  Take 50 mg by mouth daily as needed for erectile dysfunction.        Diagnostic Studies: Dg Chest 2 View  03/08/2013   CLINICAL DATA:  Hypertension, coronary artery disease  EXAM: CHEST  2 VIEW  COMPARISON:  None.  FINDINGS: Postsurgical changes are seen. The cardiac shadow is within normal limits. The lungs are clear bilaterally. Degenerative changes of the thoracic spine are noted.  IMPRESSION: No acute abnormality seen.   Electronically Signed   By: Alcide Clever M.D.   On: 03/08/2013 10:38    Disposition: Home      Discharge Orders   Future Orders Complete By Expires   Call MD / Call 911  As directed    Comments:     If you experience chest pain or shortness of breath, CALL 911 and be transported to the hospital emergency room.  If you develope a fever above 101 F, pus (white drainage) or increased drainage or redness at the wound, or calf pain, call your surgeon's office.   Constipation Prevention  As directed    Comments:     Drink plenty of fluids.  Prune juice may be helpful.  You may use a stool softener, such as Colace (over the counter) 100 mg twice a day.  Use MiraLax (over the counter) for constipation as needed.   CPM  As directed    Comments:     Continuous passive motion machine (CPM):      Use the CPM from 0 degrees to 60 degrees for 6-8 hours per day.      You may increase by 5 degrees per day.  You may break it up into 2 or 3 sessions per day.      Use CPM for 1-2 weeks or until you are told to stop.   Diet general  As directed    Do not put a pillow under the knee. Place it under the heel.  As directed    Increase activity slowly as tolerated  As directed    TED  hose  As directed    Comments:     Use stockings (TED hose) for 2  weeks on both leg(s).  You may remove them at night for sleeping.   Weight bearing as  tolerated  As directed    Questions:     Laterality:     Extremity:        Follow-up Information   Follow up with GRAVES,JOHN L, MD. Schedule an appointment as soon as possible for a visit in 2 weeks.   Specialty:  Orthopedic Surgery   Contact information:   [address] [place] [address] [phone]       Follow up with [facility]. (please refer to handout form your Dr's office)        Signed: Matthew Folks 03/20/2013, 10:55 AM

## 2013-03-20 NOTE — Progress Notes (Signed)
Late Entry: patient on CPAP, placed himself on his home unit CPAP for the night. Resting comfortably

## 2013-03-21 DIAGNOSIS — M1710 Unilateral primary osteoarthritis, unspecified knee: Secondary | ICD-10-CM | POA: Diagnosis not present

## 2013-03-21 DIAGNOSIS — Z471 Aftercare following joint replacement surgery: Secondary | ICD-10-CM | POA: Diagnosis not present

## 2013-03-22 ENCOUNTER — Encounter (HOSPITAL_COMMUNITY): Payer: Self-pay | Admitting: Orthopedic Surgery

## 2013-03-22 DIAGNOSIS — Z471 Aftercare following joint replacement surgery: Secondary | ICD-10-CM | POA: Diagnosis not present

## 2013-03-22 DIAGNOSIS — M1710 Unilateral primary osteoarthritis, unspecified knee: Secondary | ICD-10-CM | POA: Diagnosis not present

## 2013-03-24 DIAGNOSIS — M1710 Unilateral primary osteoarthritis, unspecified knee: Secondary | ICD-10-CM | POA: Diagnosis not present

## 2013-03-24 DIAGNOSIS — Z471 Aftercare following joint replacement surgery: Secondary | ICD-10-CM | POA: Diagnosis not present

## 2013-03-25 DIAGNOSIS — Z471 Aftercare following joint replacement surgery: Secondary | ICD-10-CM | POA: Diagnosis not present

## 2013-03-25 DIAGNOSIS — M1710 Unilateral primary osteoarthritis, unspecified knee: Secondary | ICD-10-CM | POA: Diagnosis not present

## 2013-03-25 NOTE — Addendum Note (Signed)
Addendum created 03/25/13 0716 by Heather Roberts, MD   Modules edited: Anesthesia Attestations

## 2013-03-28 DIAGNOSIS — M1710 Unilateral primary osteoarthritis, unspecified knee: Secondary | ICD-10-CM | POA: Diagnosis not present

## 2013-03-28 DIAGNOSIS — Z471 Aftercare following joint replacement surgery: Secondary | ICD-10-CM | POA: Diagnosis not present

## 2013-03-30 DIAGNOSIS — M1710 Unilateral primary osteoarthritis, unspecified knee: Secondary | ICD-10-CM | POA: Diagnosis not present

## 2013-03-30 DIAGNOSIS — Z471 Aftercare following joint replacement surgery: Secondary | ICD-10-CM | POA: Diagnosis not present

## 2013-04-01 DIAGNOSIS — M1710 Unilateral primary osteoarthritis, unspecified knee: Secondary | ICD-10-CM | POA: Diagnosis not present

## 2013-04-01 DIAGNOSIS — Z471 Aftercare following joint replacement surgery: Secondary | ICD-10-CM | POA: Diagnosis not present

## 2013-04-04 DIAGNOSIS — M1710 Unilateral primary osteoarthritis, unspecified knee: Secondary | ICD-10-CM | POA: Diagnosis not present

## 2013-04-04 DIAGNOSIS — M25569 Pain in unspecified knee: Secondary | ICD-10-CM | POA: Diagnosis not present

## 2013-04-04 DIAGNOSIS — Z471 Aftercare following joint replacement surgery: Secondary | ICD-10-CM | POA: Diagnosis not present

## 2013-04-11 DIAGNOSIS — M1710 Unilateral primary osteoarthritis, unspecified knee: Secondary | ICD-10-CM | POA: Diagnosis not present

## 2013-04-11 DIAGNOSIS — Z471 Aftercare following joint replacement surgery: Secondary | ICD-10-CM | POA: Diagnosis not present

## 2013-04-12 DIAGNOSIS — Z96659 Presence of unspecified artificial knee joint: Secondary | ICD-10-CM | POA: Diagnosis not present

## 2013-04-12 DIAGNOSIS — M25569 Pain in unspecified knee: Secondary | ICD-10-CM | POA: Diagnosis not present

## 2013-04-15 DIAGNOSIS — M25569 Pain in unspecified knee: Secondary | ICD-10-CM | POA: Diagnosis not present

## 2013-04-15 DIAGNOSIS — Z96659 Presence of unspecified artificial knee joint: Secondary | ICD-10-CM | POA: Diagnosis not present

## 2013-04-18 DIAGNOSIS — Z96659 Presence of unspecified artificial knee joint: Secondary | ICD-10-CM | POA: Diagnosis not present

## 2013-04-18 DIAGNOSIS — M25569 Pain in unspecified knee: Secondary | ICD-10-CM | POA: Diagnosis not present

## 2013-04-20 DIAGNOSIS — M25569 Pain in unspecified knee: Secondary | ICD-10-CM | POA: Diagnosis not present

## 2013-04-20 DIAGNOSIS — Z96659 Presence of unspecified artificial knee joint: Secondary | ICD-10-CM | POA: Diagnosis not present

## 2013-04-25 DIAGNOSIS — M25569 Pain in unspecified knee: Secondary | ICD-10-CM | POA: Diagnosis not present

## 2013-04-25 DIAGNOSIS — Z96659 Presence of unspecified artificial knee joint: Secondary | ICD-10-CM | POA: Diagnosis not present

## 2013-04-27 DIAGNOSIS — Z96659 Presence of unspecified artificial knee joint: Secondary | ICD-10-CM | POA: Diagnosis not present

## 2013-04-27 DIAGNOSIS — M25569 Pain in unspecified knee: Secondary | ICD-10-CM | POA: Diagnosis not present

## 2013-05-02 DIAGNOSIS — Z96659 Presence of unspecified artificial knee joint: Secondary | ICD-10-CM | POA: Diagnosis not present

## 2013-05-02 DIAGNOSIS — M25569 Pain in unspecified knee: Secondary | ICD-10-CM | POA: Diagnosis not present

## 2013-05-04 DIAGNOSIS — Z96659 Presence of unspecified artificial knee joint: Secondary | ICD-10-CM | POA: Diagnosis not present

## 2013-05-04 DIAGNOSIS — M25569 Pain in unspecified knee: Secondary | ICD-10-CM | POA: Diagnosis not present

## 2013-05-09 DIAGNOSIS — Z96659 Presence of unspecified artificial knee joint: Secondary | ICD-10-CM | POA: Diagnosis not present

## 2013-05-09 DIAGNOSIS — M25569 Pain in unspecified knee: Secondary | ICD-10-CM | POA: Diagnosis not present

## 2013-05-11 DIAGNOSIS — Z96659 Presence of unspecified artificial knee joint: Secondary | ICD-10-CM | POA: Diagnosis not present

## 2013-05-11 DIAGNOSIS — M25569 Pain in unspecified knee: Secondary | ICD-10-CM | POA: Diagnosis not present

## 2013-05-16 DIAGNOSIS — M25569 Pain in unspecified knee: Secondary | ICD-10-CM | POA: Diagnosis not present

## 2013-05-16 DIAGNOSIS — Z96659 Presence of unspecified artificial knee joint: Secondary | ICD-10-CM | POA: Diagnosis not present

## 2013-05-24 DIAGNOSIS — M25569 Pain in unspecified knee: Secondary | ICD-10-CM | POA: Diagnosis not present

## 2013-07-14 DIAGNOSIS — M549 Dorsalgia, unspecified: Secondary | ICD-10-CM | POA: Diagnosis not present

## 2013-08-04 DIAGNOSIS — M549 Dorsalgia, unspecified: Secondary | ICD-10-CM | POA: Diagnosis not present

## 2013-08-04 DIAGNOSIS — I10 Essential (primary) hypertension: Secondary | ICD-10-CM | POA: Diagnosis not present

## 2013-08-04 DIAGNOSIS — E7800 Pure hypercholesterolemia, unspecified: Secondary | ICD-10-CM | POA: Diagnosis not present

## 2013-08-04 DIAGNOSIS — N529 Male erectile dysfunction, unspecified: Secondary | ICD-10-CM | POA: Diagnosis not present

## 2013-08-04 DIAGNOSIS — F325 Major depressive disorder, single episode, in full remission: Secondary | ICD-10-CM | POA: Diagnosis not present

## 2013-08-23 DIAGNOSIS — M1710 Unilateral primary osteoarthritis, unspecified knee: Secondary | ICD-10-CM | POA: Diagnosis not present

## 2013-08-25 DIAGNOSIS — L570 Actinic keratosis: Secondary | ICD-10-CM | POA: Diagnosis not present

## 2013-08-25 DIAGNOSIS — C44320 Squamous cell carcinoma of skin of unspecified parts of face: Secondary | ICD-10-CM | POA: Diagnosis not present

## 2013-08-26 DIAGNOSIS — C44320 Squamous cell carcinoma of skin of unspecified parts of face: Secondary | ICD-10-CM | POA: Diagnosis not present

## 2013-10-03 DIAGNOSIS — C44320 Squamous cell carcinoma of skin of unspecified parts of face: Secondary | ICD-10-CM | POA: Diagnosis not present

## 2013-10-04 DIAGNOSIS — L905 Scar conditions and fibrosis of skin: Secondary | ICD-10-CM | POA: Diagnosis not present

## 2013-11-03 DIAGNOSIS — M1710 Unilateral primary osteoarthritis, unspecified knee: Secondary | ICD-10-CM | POA: Diagnosis not present

## 2013-12-29 DIAGNOSIS — M25569 Pain in unspecified knee: Secondary | ICD-10-CM | POA: Diagnosis not present

## 2013-12-29 DIAGNOSIS — M1710 Unilateral primary osteoarthritis, unspecified knee: Secondary | ICD-10-CM | POA: Diagnosis not present

## 2014-01-31 DIAGNOSIS — M1711 Unilateral primary osteoarthritis, right knee: Secondary | ICD-10-CM | POA: Diagnosis not present

## 2014-02-07 DIAGNOSIS — Z0000 Encounter for general adult medical examination without abnormal findings: Secondary | ICD-10-CM | POA: Diagnosis not present

## 2014-02-07 DIAGNOSIS — Z1389 Encounter for screening for other disorder: Secondary | ICD-10-CM | POA: Diagnosis not present

## 2014-02-07 DIAGNOSIS — Z6835 Body mass index (BMI) 35.0-35.9, adult: Secondary | ICD-10-CM | POA: Diagnosis not present

## 2014-02-07 DIAGNOSIS — R1013 Epigastric pain: Secondary | ICD-10-CM | POA: Diagnosis not present

## 2014-02-07 DIAGNOSIS — E780 Pure hypercholesterolemia: Secondary | ICD-10-CM | POA: Diagnosis not present

## 2014-02-07 DIAGNOSIS — E669 Obesity, unspecified: Secondary | ICD-10-CM | POA: Diagnosis not present

## 2014-02-07 DIAGNOSIS — I10 Essential (primary) hypertension: Secondary | ICD-10-CM | POA: Diagnosis not present

## 2014-02-07 DIAGNOSIS — Z23 Encounter for immunization: Secondary | ICD-10-CM | POA: Diagnosis not present

## 2014-02-21 DIAGNOSIS — H6522 Chronic serous otitis media, left ear: Secondary | ICD-10-CM | POA: Diagnosis not present

## 2014-02-21 DIAGNOSIS — H6983 Other specified disorders of Eustachian tube, bilateral: Secondary | ICD-10-CM | POA: Diagnosis not present

## 2014-02-21 DIAGNOSIS — G4733 Obstructive sleep apnea (adult) (pediatric): Secondary | ICD-10-CM | POA: Diagnosis not present

## 2014-03-14 ENCOUNTER — Ambulatory Visit: Payer: TRICARE For Life (TFL) | Admitting: Interventional Cardiology

## 2014-03-14 DIAGNOSIS — M1711 Unilateral primary osteoarthritis, right knee: Secondary | ICD-10-CM | POA: Diagnosis not present

## 2014-03-14 DIAGNOSIS — H6983 Other specified disorders of Eustachian tube, bilateral: Secondary | ICD-10-CM | POA: Diagnosis not present

## 2014-03-14 DIAGNOSIS — H908 Mixed conductive and sensorineural hearing loss, unspecified: Secondary | ICD-10-CM | POA: Diagnosis not present

## 2014-03-14 DIAGNOSIS — H6522 Chronic serous otitis media, left ear: Secondary | ICD-10-CM | POA: Diagnosis not present

## 2014-04-04 ENCOUNTER — Other Ambulatory Visit: Payer: Self-pay | Admitting: Orthopedic Surgery

## 2014-04-05 DIAGNOSIS — M4696 Unspecified inflammatory spondylopathy, lumbar region: Secondary | ICD-10-CM | POA: Diagnosis not present

## 2014-04-05 DIAGNOSIS — M545 Low back pain: Secondary | ICD-10-CM | POA: Diagnosis not present

## 2014-04-06 NOTE — Pre-Procedure Instructions (Signed)
Scott Welch  04/06/2014   Your procedure is scheduled on:  Friday April 21, 2014 at 10:15 AM.  Report to [facility] Admitting at 8:15 AM.  Call this number if you have problems the morning of surgery: [phone]   For any other questions Monday-Friday from 8am-4pm call: [phone]   Remember:   Do not eat food or drink liquids after midnight.   Take these medicines the morning of surgery with A SIP OF WATER: Escitalopram (Lexapro), Flonase nasal spray, Oxycodone if needed, and Nexium   Please stop taking Vitamins, Osteo BiFlex, Fish Oil, any Advil, Motrin, Alleve, etc on 04/14/14   Do not wear jewelry.  Do not wear lotions, powders, or cologne.   Men may shave face and neck.  Do not bring valuables to the hospital.  [facility] is not responsible for any belongings or valuables.               Contacts, dentures or bridgework may not be worn into surgery.  Leave suitcase in the car. After surgery it may be brought to your room.  For patients admitted to the hospital, discharge time is determined by your treatment team.               Patients discharged the day of surgery will not be allowed to drive home.  Name and phone number of your driver:   Special Instructions: Shower using CHG soap the night before and the morning of your surgery   Please read over the following fact sheets that you were given: Pain Booklet, Coughing and Deep Breathing, Blood Transfusion Information, Total Joint Packet, MRSA Information and Surgical Site Infection Prevention

## 2014-04-10 ENCOUNTER — Encounter (HOSPITAL_COMMUNITY): Payer: Self-pay

## 2014-04-10 ENCOUNTER — Encounter (HOSPITAL_COMMUNITY)
Admission: RE | Admit: 2014-04-10 | Discharge: 2014-04-10 | Disposition: A | Discharge status: Home / Self Care | Payer: Medicare Other | Source: Ambulatory Visit | Attending: Orthopedic Surgery | Admitting: Orthopedic Surgery

## 2014-04-10 ENCOUNTER — Ambulatory Visit (HOSPITAL_COMMUNITY)
Admission: RE | Admit: 2014-04-10 | Discharge: 2014-04-10 | Disposition: A | Discharge status: Home / Self Care | Payer: Medicare Other | Source: Ambulatory Visit | Attending: Orthopedic Surgery | Admitting: Orthopedic Surgery

## 2014-04-10 DIAGNOSIS — Z951 Presence of aortocoronary bypass graft: Secondary | ICD-10-CM | POA: Diagnosis not present

## 2014-04-10 DIAGNOSIS — Z79899 Other long term (current) drug therapy: Secondary | ICD-10-CM | POA: Diagnosis not present

## 2014-04-10 DIAGNOSIS — Z01818 Encounter for other preprocedural examination: Secondary | ICD-10-CM

## 2014-04-10 DIAGNOSIS — Z7982 Long term (current) use of aspirin: Secondary | ICD-10-CM | POA: Diagnosis not present

## 2014-04-10 DIAGNOSIS — N529 Male erectile dysfunction, unspecified: Secondary | ICD-10-CM | POA: Diagnosis present

## 2014-04-10 DIAGNOSIS — G4733 Obstructive sleep apnea (adult) (pediatric): Secondary | ICD-10-CM | POA: Diagnosis present

## 2014-04-10 DIAGNOSIS — M25561 Pain in right knee: Secondary | ICD-10-CM | POA: Diagnosis not present

## 2014-04-10 DIAGNOSIS — H6983 Other specified disorders of Eustachian tube, bilateral: Secondary | ICD-10-CM | POA: Diagnosis not present

## 2014-04-10 DIAGNOSIS — E6609 Other obesity due to excess calories: Secondary | ICD-10-CM | POA: Diagnosis present

## 2014-04-10 DIAGNOSIS — I10 Essential (primary) hypertension: Secondary | ICD-10-CM | POA: Diagnosis not present

## 2014-04-10 DIAGNOSIS — I2510 Atherosclerotic heart disease of native coronary artery without angina pectoris: Secondary | ICD-10-CM | POA: Diagnosis present

## 2014-04-10 DIAGNOSIS — Z8249 Family history of ischemic heart disease and other diseases of the circulatory system: Secondary | ICD-10-CM | POA: Diagnosis not present

## 2014-04-10 DIAGNOSIS — Z955 Presence of coronary angioplasty implant and graft: Secondary | ICD-10-CM | POA: Diagnosis not present

## 2014-04-10 DIAGNOSIS — M1711 Unilateral primary osteoarthritis, right knee: Secondary | ICD-10-CM | POA: Diagnosis not present

## 2014-04-10 DIAGNOSIS — M179 Osteoarthritis of knee, unspecified: Secondary | ICD-10-CM | POA: Diagnosis not present

## 2014-04-10 DIAGNOSIS — Z833 Family history of diabetes mellitus: Secondary | ICD-10-CM | POA: Diagnosis not present

## 2014-04-10 DIAGNOSIS — E785 Hyperlipidemia, unspecified: Secondary | ICD-10-CM | POA: Diagnosis present

## 2014-04-10 LAB — COMPREHENSIVE METABOLIC PANEL
ALBUMIN: 4 g/dL (ref 3.5–5.2)
ALT: 23 U/L (ref 0–53)
ANION GAP: 6 (ref 5–15)
AST: 22 U/L (ref 0–37)
Alkaline Phosphatase: 68 U/L (ref 39–117)
BUN: 27 mg/dL — AB (ref 6–23)
CHLORIDE: 106 meq/L (ref 96–112)
CO2: 28 mmol/L (ref 19–32)
Calcium: 9.3 mg/dL (ref 8.4–10.5)
Creatinine, Ser: 1.01 mg/dL (ref 0.50–1.35)
GFR calc Af Amer: 85 mL/min — ABNORMAL LOW (ref >90)
GFR calc non Af Amer: 73 mL/min — ABNORMAL LOW (ref >90)
GLUCOSE: 120 mg/dL — AB (ref 70–99)
POTASSIUM: 4.4 mmol/L (ref 3.5–5.1)
Sodium: 140 mmol/L (ref 135–145)
Total Bilirubin: 0.8 mg/dL (ref 0.3–1.2)
Total Protein: 6.6 g/dL (ref 6.0–8.3)

## 2014-04-10 LAB — URINALYSIS, ROUTINE W REFLEX MICROSCOPIC
Bilirubin Urine: NEGATIVE
GLUCOSE, UA: NEGATIVE mg/dL
Hgb urine dipstick: NEGATIVE
KETONES UR: NEGATIVE mg/dL
Leukocytes, UA: NEGATIVE
Nitrite: NEGATIVE
PROTEIN: NEGATIVE mg/dL
Specific Gravity, Urine: 1.021 (ref 1.005–1.030)
Urobilinogen, UA: 0.2 mg/dL (ref 0.0–1.0)
pH: 6 (ref 5.0–8.0)

## 2014-04-10 LAB — CBC WITH DIFFERENTIAL/PLATELET
Basophils Absolute: 0 10*3/uL (ref 0.0–0.1)
Basophils Relative: 1 % (ref 0–1)
Eosinophils Absolute: 0.2 10*3/uL (ref 0.0–0.7)
Eosinophils Relative: 4 % (ref 0–5)
HCT: 44.1 % (ref 39.0–52.0)
Hemoglobin: 14.9 g/dL (ref 13.0–17.0)
LYMPHS ABS: 1.1 10*3/uL (ref 0.7–4.0)
LYMPHS PCT: 27 % (ref 12–46)
MCH: 30.7 pg (ref 26.0–34.0)
MCHC: 33.8 g/dL (ref 30.0–36.0)
MCV: 90.7 fL (ref 78.0–100.0)
Monocytes Absolute: 0.3 10*3/uL (ref 0.1–1.0)
Monocytes Relative: 7 % (ref 3–12)
NEUTROS PCT: 61 % (ref 43–77)
Neutro Abs: 2.6 10*3/uL (ref 1.7–7.7)
Platelets: 184 10*3/uL (ref 150–400)
RBC: 4.86 MIL/uL (ref 4.22–5.81)
RDW: 12.8 % (ref 11.5–15.5)
WBC: 4.3 10*3/uL (ref 4.0–10.5)

## 2014-04-10 LAB — PROTIME-INR
INR: 0.99 (ref 0.00–1.49)
PROTHROMBIN TIME: 13.2 s (ref 11.6–15.2)

## 2014-04-10 LAB — SURGICAL PCR SCREEN
MRSA, PCR: NEGATIVE
STAPHYLOCOCCUS AUREUS: NEGATIVE

## 2014-04-10 LAB — APTT: aPTT: 27 seconds (ref 24–37)

## 2014-04-10 LAB — TYPE AND SCREEN
ABO/RH(D): A POS
Antibody Screen: NEGATIVE

## 2014-04-10 NOTE — Progress Notes (Signed)
PCP is Lavone Orn and Cardiologist is Daneen Schick. Patient denied having any acute cardiac or pulmonary issues. Patient also informed Nurse that he has a office visit with Dr. Tamala Julian on 04/17/14. Patient instructed to bring Bi-PAP mask DOS. Patient verbalized understanding.

## 2014-04-11 ENCOUNTER — Encounter (HOSPITAL_COMMUNITY): Payer: Self-pay

## 2014-04-11 NOTE — Progress Notes (Addendum)
Anesthesia Chart Review:  Patient is a 68 year old male scheduled for right TKA on 04/21/14 by Dr. Berenice Primas.   History includes non-smoker, CAD s/p CABG (LIMA to LAD) and DES to CX '95, HTN, OSA on BiPAP, depression, ED, nasal sinus surgery, left TKA 03/2013. BMI is consistent with obesity.  PCP is Cardiologist is Dr. Daneen Schick, next visit scheduled for 04/18/14.  Preoperative CXR and labs noted.   EKG on 04/10/14 showed SR, frequent PVCs, minimal voltage criteria for LVH.  Last stress test currently available is from 2008 (scanned under Media tab).  Chart will be left for follow-up regarding cardiology input from his visit with Dr. Tamala Julian next week.  George Hugh [facility] Short Stay Center/Anesthesiology Phone [phone] 04/11/2014 1:17 PM  Addendum:  Patient seen by Dr. Tamala Julian earlier today and was cleared for upcoming right TKA.  Routine carotid duplex was ordered, but no preoperative ischemic testing recommended.  George Hugh [facility] Short Stay Center/Anesthesiology Phone [phone] 04/18/2014 1:48 PM

## 2014-04-12 DIAGNOSIS — M25561 Pain in right knee: Secondary | ICD-10-CM | POA: Diagnosis not present

## 2014-04-17 DIAGNOSIS — M545 Low back pain: Secondary | ICD-10-CM | POA: Diagnosis not present

## 2014-04-18 ENCOUNTER — Ambulatory Visit (INDEPENDENT_AMBULATORY_CARE_PROVIDER_SITE_OTHER): Payer: Medicare Other | Admitting: Interventional Cardiology

## 2014-04-18 ENCOUNTER — Encounter: Payer: Self-pay | Admitting: Interventional Cardiology

## 2014-04-18 VITALS — BP 128/70 | HR 85 | Ht 74.0 in | Wt 264.4 lb

## 2014-04-18 DIAGNOSIS — E785 Hyperlipidemia, unspecified: Secondary | ICD-10-CM | POA: Diagnosis not present

## 2014-04-18 DIAGNOSIS — I6523 Occlusion and stenosis of bilateral carotid arteries: Secondary | ICD-10-CM

## 2014-04-18 DIAGNOSIS — I10 Essential (primary) hypertension: Secondary | ICD-10-CM

## 2014-04-18 DIAGNOSIS — G4733 Obstructive sleep apnea (adult) (pediatric): Secondary | ICD-10-CM | POA: Diagnosis not present

## 2014-04-18 DIAGNOSIS — M545 Low back pain: Secondary | ICD-10-CM | POA: Diagnosis not present

## 2014-04-18 DIAGNOSIS — I2510 Atherosclerotic heart disease of native coronary artery without angina pectoris: Secondary | ICD-10-CM | POA: Diagnosis not present

## 2014-04-18 DIAGNOSIS — I739 Peripheral vascular disease, unspecified: Secondary | ICD-10-CM

## 2014-04-18 DIAGNOSIS — I779 Disorder of arteries and arterioles, unspecified: Secondary | ICD-10-CM | POA: Insufficient documentation

## 2014-04-18 NOTE — Progress Notes (Signed)
Patient ID: Scott Welch, male   DOB: [birth year], 69 y.o.   MRN: [id number]    [address], [address] [place], [place]  [address] Phone: [phone] Fax:  [phone]  Date:  04/18/2014   ID:  Scott Welch, DOB 1946-02-09, MRN [id number]  PCP:  Irven Shelling, MD   ASSESSMENT:  1. Coronary artery disease with prior multivessel stenting 2. Carotid disease not recently evaluated 3. Essential hypertension 4. Hyperlipidemia  PLAN:  1. Bilateral carotid Doppler 2. Clinical follow-up 1 year 3. Cleared for upcoming right knee replacement surgery, Dr. Berenice Primas 4. Aerobic activity   SUBJECTIVE: Scott Welch is a 69 y.o. male who is doing well. Has not had chest discomfort, syncope, or claudication. He has a history of nonobstructive carotid disease and no recent carotid evaluation since he left [place] greater than 5 years ago. He denies medication side effects. There is no peripheral edema. He denies orthopnea.   Wt Readings from Last 3 Encounters:  04/18/14 264 lb 6.4 oz (119.931 kg)  04/10/14 260 lb 1.6 oz (117.981 kg)  03/08/13 253 lb 6.4 oz (114.941 kg)     Past Medical History  Diagnosis Date  . Hypertension   . Depression   . Knee pain     followed by Dr. Berenice Primas at [facility]  . Coronary artery disease     one-vessel followed by Dr. Tamala Julian  . OSA on CPAP     since 1995. BIPAP  . Obesity   . Erectile dysfunction   . Osteoarthritis     left knee, Dr. Berenice Primas  . Allergic rhinitis   . Plantar fasciitis   . Cervical radiculopathy     2008  . Acne   . DJD (degenerative joint disease)     [place]  . Ear congestion     ear tubes, [place]  . Fractures     left elbow, right elbow, nose, multiple fingers, left clavicle    Current Outpatient Prescriptions  Medication Sig Dispense Refill  . aspirin EC 81 MG tablet Take 81 mg by mouth daily.    Marland Kitchen atorvastatin (LIPITOR) 20 MG tablet Take 20 mg by mouth daily.    . cetirizine (ZYRTEC) 10 MG tablet Take  10 mg by mouth daily.    Marland Kitchen escitalopram (LEXAPRO) 10 MG tablet Take 10 mg by mouth daily.    Marland Kitchen esomeprazole (NEXIUM) 20 MG capsule Take 20 mg by mouth every morning.    . fluticasone (FLONASE) 50 MCG/ACT nasal spray Place 2 sprays into the nose daily as needed for allergies.     Marland Kitchen lisinopril (PRINIVIL,ZESTRIL) 20 MG tablet Take 20 mg by mouth daily.    . Menthol, Topical Analgesic, (ICY HOT EX) Apply 1 application topically daily as needed (applies for back pain).    . methocarbamol (ROBAXIN) 500 MG tablet Take 1 tablet (500 mg total) by mouth every 6 (six) hours as needed for muscle spasms. 40 tablet 0  . Misc Natural Products (OSTEO BI-FLEX TRIPLE STRENGTH PO) Take 1 tablet by mouth daily.     . Multiple Vitamin (MULTIVITAMIN) tablet Take 1 tablet by mouth daily.    . Omega-3 Fatty Acids (FISH OIL PO) Take 1 capsule by mouth daily.    Marland Kitchen oxyCODONE-acetaminophen (PERCOCET/ROXICET) 5-325 MG per tablet Take 1-2 tablets by mouth every 6 (six) hours as needed for moderate pain. 50 tablet 0  . sildenafil (VIAGRA) 50 MG tablet Take 50 mg by mouth daily as needed for erectile dysfunction.  No current facility-administered medications for this visit.    Allergies:   No Known Allergies  Social History:  The patient  reports that he has never smoked. He does not have any smokeless tobacco history on file. He reports that he drinks about 1.2 oz of alcohol per week. He reports that he does not use illicit drugs.   ROS:  Please see the history of present illness.   No blood in the urine or stool. Walks 3 miles a day without angina. Has intermittent right upper chest discomfort that is random and non-exertion related.   All other systems reviewed and negative.   OBJECTIVE: VS:  BP 128/70 mmHg  Pulse 85  Ht 6\' 2"  (1.88 m)  Wt 264 lb 6.4 oz (119.931 kg)  BMI 33.93 kg/m2  SpO2 98% Well nourished, well developed, in no acute distress, obese HEENT: normal Neck: JVD flat. Carotid bruit absent    Cardiac:  normal S1, S2; RRR; no murmur Lungs:  clear to auscultation bilaterally, no wheezing, rhonchi or rales Abd: soft, nontender, no hepatomegaly Ext: Edema absent. Pulses 2+ and symmetric Skin: warm and dry Neuro:  CNs 2-12 intact, no focal abnormalities noted  EKG:  December 2015 preadmission tracing reveals normal sinus rhythm with isolated PVC       Signed, Illene Labrador III, MD 04/18/2014 11:08 AM

## 2014-04-18 NOTE — Patient Instructions (Signed)
Your physician recommends that you continue on your current medications as directed. Please refer to the Current Medication list given to you today.  Your physician has requested that you have a carotid duplex. This test is an ultrasound of the carotid arteries in your neck. It looks at blood flow through these arteries that supply the brain with blood. Allow one hour for this exam. There are no restrictions or special instructions.  Your physician wants you to follow-up in: 1 year with Dr. Tamala Julian. You will receive a reminder letter in the mail two months in advance. If you don't receive a letter, please call our office to schedule the follow-up appointment.

## 2014-04-19 ENCOUNTER — Ambulatory Visit (HOSPITAL_BASED_OUTPATIENT_CLINIC_OR_DEPARTMENT_OTHER): Payer: Medicare Other | Admitting: Cardiology

## 2014-04-19 DIAGNOSIS — I6523 Occlusion and stenosis of bilateral carotid arteries: Secondary | ICD-10-CM

## 2014-04-19 NOTE — Progress Notes (Signed)
Carotid duplex performed 

## 2014-04-20 MED ORDER — CEFAZOLIN SODIUM-DEXTROSE 2-3 GM-% IV SOLR: 2.0000 g | INTRAVENOUS | Status: DC

## 2014-04-20 MED ORDER — DEXTROSE 5 % IV SOLN
3.0000 g | INTRAVENOUS | Status: AC
  Administered 2014-04-21: 3 g via INTRAVENOUS
  Filled 2014-04-20: qty 3000

## 2014-04-21 ENCOUNTER — Inpatient Hospital Stay (HOSPITAL_COMMUNITY): Payer: Medicare Other | Admitting: Vascular Surgery

## 2014-04-21 ENCOUNTER — Encounter (HOSPITAL_COMMUNITY): Payer: Self-pay | Admitting: *Deleted

## 2014-04-21 ENCOUNTER — Inpatient Hospital Stay (HOSPITAL_COMMUNITY)
Admission: RE | Admit: 2014-04-21 | Discharge: 2014-04-23 | DRG: 470 | Disposition: A | Discharge status: Home Health | Payer: Medicare Other | Source: Ambulatory Visit | Attending: Orthopedic Surgery | Admitting: Orthopedic Surgery

## 2014-04-21 ENCOUNTER — Inpatient Hospital Stay (HOSPITAL_COMMUNITY): Payer: Medicare Other | Admitting: Anesthesiology

## 2014-04-21 ENCOUNTER — Encounter (HOSPITAL_COMMUNITY)
Admission: RE | Disposition: A | Discharge status: Home Health | Payer: Self-pay | Source: Ambulatory Visit | Attending: Orthopedic Surgery

## 2014-04-21 DIAGNOSIS — Z79899 Other long term (current) drug therapy: Secondary | ICD-10-CM

## 2014-04-21 DIAGNOSIS — Z955 Presence of coronary angioplasty implant and graft: Secondary | ICD-10-CM | POA: Diagnosis not present

## 2014-04-21 DIAGNOSIS — G4733 Obstructive sleep apnea (adult) (pediatric): Secondary | ICD-10-CM | POA: Diagnosis present

## 2014-04-21 DIAGNOSIS — Z951 Presence of aortocoronary bypass graft: Secondary | ICD-10-CM | POA: Diagnosis not present

## 2014-04-21 DIAGNOSIS — Z833 Family history of diabetes mellitus: Secondary | ICD-10-CM

## 2014-04-21 DIAGNOSIS — N529 Male erectile dysfunction, unspecified: Secondary | ICD-10-CM | POA: Diagnosis present

## 2014-04-21 DIAGNOSIS — Z7982 Long term (current) use of aspirin: Secondary | ICD-10-CM

## 2014-04-21 DIAGNOSIS — I10 Essential (primary) hypertension: Secondary | ICD-10-CM | POA: Diagnosis present

## 2014-04-21 DIAGNOSIS — M179 Osteoarthritis of knee, unspecified: Secondary | ICD-10-CM | POA: Diagnosis not present

## 2014-04-21 DIAGNOSIS — I2510 Atherosclerotic heart disease of native coronary artery without angina pectoris: Secondary | ICD-10-CM | POA: Diagnosis present

## 2014-04-21 DIAGNOSIS — Z8249 Family history of ischemic heart disease and other diseases of the circulatory system: Secondary | ICD-10-CM | POA: Diagnosis not present

## 2014-04-21 DIAGNOSIS — M1711 Unilateral primary osteoarthritis, right knee: Secondary | ICD-10-CM | POA: Diagnosis not present

## 2014-04-21 DIAGNOSIS — E785 Hyperlipidemia, unspecified: Secondary | ICD-10-CM | POA: Diagnosis present

## 2014-04-21 DIAGNOSIS — M25561 Pain in right knee: Secondary | ICD-10-CM | POA: Diagnosis present

## 2014-04-21 DIAGNOSIS — E6609 Other obesity due to excess calories: Secondary | ICD-10-CM | POA: Diagnosis present

## 2014-04-21 HISTORY — PX: TOTAL KNEE ARTHROPLASTY: SHX125

## 2014-04-21 SURGERY — ARTHROPLASTY, KNEE, TOTAL
Anesthesia: Spinal | Site: Knee | Laterality: Right

## 2014-04-21 MED ORDER — TRANEXAMIC ACID 100 MG/ML IV SOLN
2000.0000 mg | INTRAVENOUS | Status: AC
  Administered 2014-04-21: 2000 mg via TOPICAL
  Filled 2014-04-21: qty 20

## 2014-04-21 MED ORDER — FENTANYL CITRATE 0.05 MG/ML IJ SOLN
50.0000 ug | INTRAMUSCULAR | Status: DC | PRN
  Filled 2014-04-21: qty 2

## 2014-04-21 MED ORDER — ONDANSETRON HCL 4 MG/2ML IJ SOLN
4.0000 mg | Freq: Four times a day (QID) | INTRAMUSCULAR | Status: DC | PRN
  Administered 2014-04-22: 4 mg via INTRAVENOUS
  Filled 2014-04-21: qty 2

## 2014-04-21 MED ORDER — HYDROMORPHONE HCL 1 MG/ML IJ SOLN: 0.2500 mg | INTRAMUSCULAR | Status: DC | PRN

## 2014-04-21 MED ORDER — DIPHENHYDRAMINE HCL 12.5 MG/5ML PO ELIX: 12.5000 mg | ORAL_SOLUTION | ORAL | Status: DC | PRN

## 2014-04-21 MED ORDER — SODIUM CHLORIDE 0.9 % IR SOLN
Status: DC | PRN
  Administered 2014-04-21 (×2): 1000 mL

## 2014-04-21 MED ORDER — BUPIVACAINE IN DEXTROSE 0.75-8.25 % IT SOLN
INTRATHECAL | Status: DC | PRN
  Administered 2014-04-21: 15 mg via INTRATHECAL

## 2014-04-21 MED ORDER — OXYCODONE-ACETAMINOPHEN 5-325 MG PO TABS: 1.0000 | ORAL_TABLET | ORAL | Status: DC | PRN

## 2014-04-21 MED ORDER — ESCITALOPRAM OXALATE 10 MG PO TABS
10.0000 mg | ORAL_TABLET | Freq: Every day | ORAL | Status: DC
  Administered 2014-04-22 – 2014-04-23 (×2): 10 mg via ORAL
  Filled 2014-04-21 (×2): qty 1

## 2014-04-21 MED ORDER — ACETAMINOPHEN 325 MG PO TABS: 650.0000 mg | ORAL_TABLET | Freq: Four times a day (QID) | ORAL | Status: DC | PRN

## 2014-04-21 MED ORDER — KETOROLAC TROMETHAMINE 15 MG/ML IJ SOLN
7.5000 mg | Freq: Four times a day (QID) | INTRAMUSCULAR | Status: AC
  Administered 2014-04-21 – 2014-04-22 (×4): 7.5 mg via INTRAVENOUS
  Filled 2014-04-21: qty 1

## 2014-04-21 MED ORDER — HYDROMORPHONE HCL 1 MG/ML IJ SOLN: 1.0000 mg | INTRAMUSCULAR | Status: DC | PRN

## 2014-04-21 MED ORDER — ONDANSETRON HCL 4 MG PO TABS: 4.0000 mg | ORAL_TABLET | Freq: Four times a day (QID) | ORAL | Status: DC | PRN

## 2014-04-21 MED ORDER — MIDAZOLAM HCL 2 MG/2ML IJ SOLN
INTRAMUSCULAR | Status: AC
  Filled 2014-04-21: qty 2

## 2014-04-21 MED ORDER — BISACODYL 5 MG PO TBEC: 5.0000 mg | DELAYED_RELEASE_TABLET | Freq: Every day | ORAL | Status: DC | PRN

## 2014-04-21 MED ORDER — ALUM & MAG HYDROXIDE-SIMETH 200-200-20 MG/5ML PO SUSP
30.0000 mL | ORAL | Status: DC | PRN
  Administered 2014-04-23 (×2): 30 mL via ORAL
  Filled 2014-04-21 (×2): qty 30

## 2014-04-21 MED ORDER — LORATADINE 10 MG PO TABS
10.0000 mg | ORAL_TABLET | Freq: Every day | ORAL | Status: DC
  Administered 2014-04-22 – 2014-04-23 (×2): 10 mg via ORAL
  Filled 2014-04-21 (×2): qty 1

## 2014-04-21 MED ORDER — FLEET ENEMA 7-19 GM/118ML RE ENEM: 1.0000 | ENEMA | Freq: Once | RECTAL | Status: AC | PRN

## 2014-04-21 MED ORDER — BUPIVACAINE HCL (PF) 0.5 % IJ SOLN
INTRAMUSCULAR | Status: DC | PRN
  Administered 2014-04-21: 20 mL

## 2014-04-21 MED ORDER — METHOCARBAMOL 500 MG PO TABS: 500.0000 mg | ORAL_TABLET | Freq: Three times a day (TID) | ORAL | Status: DC | PRN

## 2014-04-21 MED ORDER — LACTATED RINGERS IV SOLN
INTRAVENOUS | Status: DC
  Administered 2014-04-21: 09:00:00 via INTRAVENOUS

## 2014-04-21 MED ORDER — PROMETHAZINE HCL 25 MG/ML IJ SOLN: 6.2500 mg | INTRAMUSCULAR | Status: DC | PRN

## 2014-04-21 MED ORDER — ATORVASTATIN CALCIUM 20 MG PO TABS
20.0000 mg | ORAL_TABLET | Freq: Every day | ORAL | Status: DC
  Administered 2014-04-22 – 2014-04-23 (×2): 20 mg via ORAL
  Filled 2014-04-21 (×2): qty 1

## 2014-04-21 MED ORDER — GLYCOPYRROLATE 0.2 MG/ML IJ SOLN
INTRAMUSCULAR | Status: DC | PRN
  Administered 2014-04-21: .2 mg via INTRAVENOUS

## 2014-04-21 MED ORDER — POLYETHYLENE GLYCOL 3350 17 G PO PACK: 17.0000 g | PACK | Freq: Every day | ORAL | Status: DC | PRN

## 2014-04-21 MED ORDER — PANTOPRAZOLE SODIUM 40 MG PO TBEC
40.0000 mg | DELAYED_RELEASE_TABLET | Freq: Every day | ORAL | Status: DC
  Administered 2014-04-22 – 2014-04-23 (×2): 40 mg via ORAL
  Filled 2014-04-21 (×2): qty 1

## 2014-04-21 MED ORDER — SODIUM CHLORIDE 0.9 % IV SOLN
INTRAVENOUS | Status: DC
  Administered 2014-04-21 – 2014-04-22 (×2): via INTRAVENOUS

## 2014-04-21 MED ORDER — MIDAZOLAM HCL 5 MG/5ML IJ SOLN
INTRAMUSCULAR | Status: DC | PRN
  Administered 2014-04-21: 2 mg via INTRAVENOUS

## 2014-04-21 MED ORDER — CHLORHEXIDINE GLUCONATE 4 % EX LIQD
60.0000 mL | Freq: Once | CUTANEOUS | Status: DC
Start: 2014-04-21 — End: 2014-04-21
  Filled 2014-04-21: qty 60

## 2014-04-21 MED ORDER — ASPIRIN EC 325 MG PO TBEC
325.0000 mg | DELAYED_RELEASE_TABLET | Freq: Two times a day (BID) | ORAL | Status: DC
  Administered 2014-04-21 – 2014-04-23 (×4): 325 mg via ORAL
  Filled 2014-04-21 (×6): qty 1

## 2014-04-21 MED ORDER — LACTATED RINGERS IV SOLN
INTRAVENOUS | Status: DC | PRN
  Administered 2014-04-21: 09:00:00 via INTRAVENOUS

## 2014-04-21 MED ORDER — METHOCARBAMOL 500 MG PO TABS
500.0000 mg | ORAL_TABLET | Freq: Four times a day (QID) | ORAL | Status: DC | PRN
  Administered 2014-04-22 – 2014-04-23 (×4): 500 mg via ORAL
  Filled 2014-04-21 (×4): qty 1

## 2014-04-21 MED ORDER — ASPIRIN EC 325 MG PO TBEC: 325.0000 mg | DELAYED_RELEASE_TABLET | Freq: Two times a day (BID) | ORAL | Status: DC

## 2014-04-21 MED ORDER — MIDAZOLAM HCL 2 MG/2ML IJ SOLN
1.0000 mg | INTRAMUSCULAR | Status: DC | PRN
  Filled 2014-04-21: qty 2

## 2014-04-21 MED ORDER — OXYCODONE-ACETAMINOPHEN 5-325 MG PO TABS
1.0000 | ORAL_TABLET | ORAL | Status: DC | PRN
  Administered 2014-04-21 (×2): 2 via ORAL
  Administered 2014-04-22 (×2): 1 via ORAL
  Administered 2014-04-22: 2 via ORAL
  Administered 2014-04-22 – 2014-04-23 (×3): 1 via ORAL
  Filled 2014-04-21: qty 1
  Filled 2014-04-21: qty 2
  Filled 2014-04-21: qty 1
  Filled 2014-04-21: qty 2
  Filled 2014-04-21: qty 1
  Filled 2014-04-21: qty 2
  Filled 2014-04-21 (×2): qty 1

## 2014-04-21 MED ORDER — FENTANYL CITRATE 0.05 MG/ML IJ SOLN
INTRAMUSCULAR | Status: DC | PRN
  Administered 2014-04-21: 50 ug via INTRAVENOUS

## 2014-04-21 MED ORDER — LISINOPRIL 20 MG PO TABS
20.0000 mg | ORAL_TABLET | Freq: Every day | ORAL | Status: DC
  Administered 2014-04-22 – 2014-04-23 (×2): 20 mg via ORAL
  Filled 2014-04-21 (×2): qty 1

## 2014-04-21 MED ORDER — DEXTROSE 5 % IV SOLN
500.0000 mg | Freq: Four times a day (QID) | INTRAVENOUS | Status: DC | PRN
  Filled 2014-04-21: qty 5

## 2014-04-21 MED ORDER — ONDANSETRON HCL 4 MG/2ML IJ SOLN
INTRAMUSCULAR | Status: DC | PRN
  Administered 2014-04-21: 4 mg via INTRAVENOUS

## 2014-04-21 MED ORDER — BUPIVACAINE LIPOSOME 1.3 % IJ SUSP
INTRAMUSCULAR | Status: DC | PRN
  Administered 2014-04-21: 20 mL

## 2014-04-21 MED ORDER — BUPIVACAINE LIPOSOME 1.3 % IJ SUSP
20.0000 mL | INTRAMUSCULAR | Status: DC
  Filled 2014-04-21: qty 20

## 2014-04-21 MED ORDER — PROPOFOL INFUSION 10 MG/ML OPTIME
INTRAVENOUS | Status: DC | PRN
  Administered 2014-04-21: 120 ug/kg/min via INTRAVENOUS

## 2014-04-21 MED ORDER — FENTANYL CITRATE 0.05 MG/ML IJ SOLN
INTRAMUSCULAR | Status: AC
  Filled 2014-04-21: qty 5

## 2014-04-21 MED ORDER — PHENYLEPHRINE 40 MCG/ML (10ML) SYRINGE FOR IV PUSH (FOR BLOOD PRESSURE SUPPORT)
PREFILLED_SYRINGE | INTRAVENOUS | Status: AC
  Filled 2014-04-21: qty 10

## 2014-04-21 MED ORDER — DOCUSATE SODIUM 100 MG PO CAPS
100.0000 mg | ORAL_CAPSULE | Freq: Two times a day (BID) | ORAL | Status: DC
  Administered 2014-04-21 – 2014-04-23 (×4): 100 mg via ORAL
  Filled 2014-04-21 (×5): qty 1

## 2014-04-21 MED ORDER — PROPOFOL 10 MG/ML IV BOLUS
INTRAVENOUS | Status: AC
  Filled 2014-04-21: qty 20

## 2014-04-21 MED ORDER — ACETAMINOPHEN 650 MG RE SUPP: 650.0000 mg | Freq: Four times a day (QID) | RECTAL | Status: DC | PRN

## 2014-04-21 MED ORDER — PROMETHAZINE HCL 25 MG/ML IJ SOLN: 6.2500 mg | Freq: Four times a day (QID) | INTRAMUSCULAR | Status: DC | PRN

## 2014-04-21 MED ORDER — METHOCARBAMOL 500 MG PO TABS: 500.0000 mg | ORAL_TABLET | Freq: Four times a day (QID) | ORAL | Status: DC | PRN

## 2014-04-21 MED ORDER — CEFAZOLIN SODIUM-DEXTROSE 2-3 GM-% IV SOLR
2.0000 g | Freq: Four times a day (QID) | INTRAVENOUS | Status: AC
  Administered 2014-04-21 (×2): 2 g via INTRAVENOUS
  Filled 2014-04-21 (×2): qty 50

## 2014-04-21 SURGICAL SUPPLY — 73 items
BAG DECANTER FOR FLEXI CONT (MISCELLANEOUS) ×3 IMPLANT
BANDAGE ESMARK 6X9 LF (GAUZE/BANDAGES/DRESSINGS) ×1 IMPLANT
BENZOIN TINCTURE PRP APPL 2/3 (GAUZE/BANDAGES/DRESSINGS) ×3 IMPLANT
BLADE SAGITTAL 25.0X1.19X90 (BLADE) ×2 IMPLANT
BLADE SAGITTAL 25.0X1.19X90MM (BLADE) ×1
BLADE SAW SAG 90X13X1.27 (BLADE) ×3 IMPLANT
BNDG ESMARK 6X9 LF (GAUZE/BANDAGES/DRESSINGS) ×3
BOWL SMART MIX CTS (DISPOSABLE) ×3 IMPLANT
CAP KNEE TOTAL 3 SIGMA ×3 IMPLANT
CEMENT HV SMART SET (Cement) ×6 IMPLANT
CLOSURE WOUND 1/2 X4 (GAUZE/BANDAGES/DRESSINGS) ×1
COVER SURGICAL LIGHT HANDLE (MISCELLANEOUS) ×3 IMPLANT
CUFF TOURNIQUET SINGLE 34IN LL (TOURNIQUET CUFF) ×3 IMPLANT
CUFF TOURNIQUET SINGLE 44IN (TOURNIQUET CUFF) IMPLANT
DRAPE EXTREMITY T 121X128X90 (DRAPE) ×3 IMPLANT
DRAPE IMP U-DRAPE 54X76 (DRAPES) ×3 IMPLANT
DRAPE U-SHAPE 47X51 STRL (DRAPES) ×3 IMPLANT
DRSG MEPILEX BORDER 4X8 (GAUZE/BANDAGES/DRESSINGS) ×3 IMPLANT
DRSG PAD ABDOMINAL 8X10 ST (GAUZE/BANDAGES/DRESSINGS) ×3 IMPLANT
DURAPREP 26ML APPLICATOR (WOUND CARE) ×3 IMPLANT
ELECT REM PT RETURN 9FT ADLT (ELECTROSURGICAL) ×3
ELECTRODE REM PT RTRN 9FT ADLT (ELECTROSURGICAL) ×1 IMPLANT
EVACUATOR 1/8 PVC DRAIN (DRAIN) ×3 IMPLANT
FACESHIELD WRAPAROUND (MASK) ×3 IMPLANT
GAUZE SPONGE 4X4 12PLY STRL (GAUZE/BANDAGES/DRESSINGS) ×3 IMPLANT
GAUZE XEROFORM 5X9 LF (GAUZE/BANDAGES/DRESSINGS) ×3 IMPLANT
GLOVE BIO SURGEON STRL SZ 6.5 (GLOVE) ×2 IMPLANT
GLOVE BIO SURGEON STRL SZ8 (GLOVE) ×3 IMPLANT
GLOVE BIO SURGEONS STRL SZ 6.5 (GLOVE) ×1
GLOVE BIOGEL PI IND STRL 6.5 (GLOVE) ×1 IMPLANT
GLOVE BIOGEL PI IND STRL 7.5 (GLOVE) ×1 IMPLANT
GLOVE BIOGEL PI IND STRL 8 (GLOVE) ×2 IMPLANT
GLOVE BIOGEL PI INDICATOR 6.5 (GLOVE) ×2
GLOVE BIOGEL PI INDICATOR 7.5 (GLOVE) ×2
GLOVE BIOGEL PI INDICATOR 8 (GLOVE) ×4
GLOVE ECLIPSE 7.5 STRL STRAW (GLOVE) ×12 IMPLANT
GLOVE SURG SS PI 6.5 STRL IVOR (GLOVE) ×3 IMPLANT
GOWN STRL REUS W/ TWL LRG LVL3 (GOWN DISPOSABLE) ×2 IMPLANT
GOWN STRL REUS W/ TWL XL LVL3 (GOWN DISPOSABLE) ×2 IMPLANT
GOWN STRL REUS W/TWL LRG LVL3 (GOWN DISPOSABLE) ×4
GOWN STRL REUS W/TWL XL LVL3 (GOWN DISPOSABLE) ×4
HANDPIECE INTERPULSE COAX TIP (DISPOSABLE) ×2
HOOD PEEL AWAY FACE SHEILD DIS (HOOD) ×6 IMPLANT
IMMOBILIZER KNEE 20 (SOFTGOODS) ×3 IMPLANT
IMMOBILIZER KNEE 20 THIGH 36 (SOFTGOODS) ×1 IMPLANT
IMMOBILIZER KNEE 22 UNIV (SOFTGOODS) ×3 IMPLANT
KIT BASIN OR (CUSTOM PROCEDURE TRAY) ×3 IMPLANT
KIT ROOM TURNOVER OR (KITS) ×3 IMPLANT
MANIFOLD NEPTUNE II (INSTRUMENTS) ×3 IMPLANT
NEEDLE SPNL 22GX3.5 QUINCKE BK (NEEDLE) ×3 IMPLANT
NS IRRIG 1000ML POUR BTL (IV SOLUTION) ×3 IMPLANT
PACK TOTAL JOINT (CUSTOM PROCEDURE TRAY) ×3 IMPLANT
PACK UNIVERSAL I (CUSTOM PROCEDURE TRAY) ×3 IMPLANT
PAD ARMBOARD 7.5X6 YLW CONV (MISCELLANEOUS) ×3 IMPLANT
PAD CAST 4YDX4 CTTN HI CHSV (CAST SUPPLIES) ×1 IMPLANT
PADDING CAST COTTON 4X4 STRL (CAST SUPPLIES) ×2
SET HNDPC FAN SPRY TIP SCT (DISPOSABLE) ×1 IMPLANT
STAPLER VISISTAT 35W (STAPLE) IMPLANT
STRIP CLOSURE SKIN 1/2X4 (GAUZE/BANDAGES/DRESSINGS) ×2 IMPLANT
SUCTION FRAZIER TIP 10 FR DISP (SUCTIONS) IMPLANT
SUT MNCRL AB 3-0 PS2 18 (SUTURE) ×3 IMPLANT
SUT VIC AB 0 CTB1 27 (SUTURE) ×6 IMPLANT
SUT VIC AB 1 CT1 27 (SUTURE) ×4
SUT VIC AB 1 CT1 27XBRD ANBCTR (SUTURE) ×2 IMPLANT
SUT VIC AB 2-0 CTB1 (SUTURE) ×6 IMPLANT
SYR 30ML LL (SYRINGE) ×3 IMPLANT
SYR 50ML LL SCALE MARK (SYRINGE) ×3 IMPLANT
SYR CONTROL 10ML LL (SYRINGE) IMPLANT
TOWEL OR 17X24 6PK STRL BLUE (TOWEL DISPOSABLE) ×3 IMPLANT
TOWEL OR 17X26 10 PK STRL BLUE (TOWEL DISPOSABLE) ×3 IMPLANT
TUBE CONNECTING 12'X1/4 (SUCTIONS) ×1
TUBE CONNECTING 12X1/4 (SUCTIONS) ×2 IMPLANT
YANKAUER SUCT BULB TIP NO VENT (SUCTIONS) ×3 IMPLANT

## 2014-04-21 NOTE — Progress Notes (Signed)
Orthopedic Tech Progress Note Patient Details:  Scott Welch 01/17/1946 [id number] CPM applied to RLE with appropriate settings. OHF applied to bed. CPM Right Knee CPM Right Knee: On Right Knee Flexion (Degrees): 90 Right Knee Extension (Degrees): 0   Asia R Thompson 04/21/2014, 1:11 PM

## 2014-04-21 NOTE — Anesthesia Postprocedure Evaluation (Signed)
  Anesthesia Post-op Note  Patient: Scott Welch  Procedure(s) Performed: Procedure(s): TOTAL KNEE ARTHROPLASTY (Right)  Patient Location: PACU  Anesthesia Type:Spinal  Level of Consciousness: awake and alert   Airway and Oxygen Therapy: Patient Spontanous Breathing  Post-op Pain: none  Post-op Assessment: Post-op Vital signs reviewed  Post-op Vital Signs: stable  Last Vitals:  Filed Vitals:   04/21/14 1315  BP:   Pulse: 48  Temp:   Resp: 16    Complications: No apparent anesthesia complications

## 2014-04-21 NOTE — Transfer of Care (Signed)
Immediate Anesthesia Transfer of Care Note  Patient: Scott Welch  Procedure(s) Performed: Procedure(s): TOTAL KNEE ARTHROPLASTY (Right)  Patient Location: PACU  Anesthesia Type:General  Level of Consciousness: awake  Airway & Oxygen Therapy: Patient Spontanous Breathing and Patient connected to nasal cannula oxygen  Post-op Assessment: Report given to PACU RN, Post -op Vital signs reviewed and stable, Patient moving all extremities and Patient moving all extremities X 4  Post vital signs: Reviewed and stable  Complications: No apparent anesthesia complications

## 2014-04-21 NOTE — Anesthesia Procedure Notes (Addendum)
Spinal Patient location during procedure: OR Start time: 04/21/2014 9:55 AM End time: 04/21/2014 10:07 AM Staffing Anesthesiologist: MASSAGEE, TERRY Performed by: anesthesiologist  Preanesthetic Checklist Completed: patient identified, site marked, surgical consent, pre-op evaluation, timeout performed, IV checked, risks and benefits discussed and monitors and equipment checked Spinal Block Patient position: sitting Prep: Betadine Approach: right paramedian Location: L3-4 Injection technique: single-shot Needle Needle type: Tuohy  Needle gauge: 25 G Needle length: 9 cm Needle insertion depth: 9 cm Assessment Sensory level: T6 Additional Notes Tolerated well  Date/Time: 04/21/2014 10:15 AM Performed by: Izora Gala Pre-anesthesia Checklist: Patient identified, Emergency Drugs available, Suction available and Patient being monitored Patient Re-evaluated:Patient Re-evaluated prior to inductionOxygen Delivery Method: Simple face mask Ventilation: Oral airway inserted - appropriate to patient size Placement Confirmation: positive ETCO2

## 2014-04-21 NOTE — Evaluation (Signed)
Physical Therapy Evaluation Patient Details Name: Scott Welch MRN: [id number] DOB: March 04, 1946 Today's Date: 04/21/2014   History of Present Illness  Pt admitted for elective TKA on the R.  Clinical Impression  Pt admitted with/for elective TKA.  Pt currently limited functionally due to the problems listed below.  (see problems list.)  Pt will benefit from PT to maximize function and safety to be able to get home safely with available assist of family.     Follow Up Recommendations Home health PT    Equipment Recommendations  None recommended by PT    Recommendations for Other Services       Precautions / Restrictions Precautions Precautions: Knee Precaution Booklet Issued: Yes (comment) Required Braces or Orthoses: Knee Immobilizer - Right (until quad stability) Restrictions Weight Bearing Restrictions: Yes RLE Weight Bearing: Weight bearing as tolerated      Mobility  Bed Mobility Overal bed mobility: Needs Assistance Bed Mobility: Supine to Sit     Supine to sit: Supervision        Transfers Overall transfer level: Needs assistance Equipment used: Rolling walker (2 wheeled) Transfers: Sit to/from Stand Sit to Stand: Supervision         General transfer comment: from multiple height surfaces  Ambulation/Gait Ambulation/Gait assistance: Supervision Ambulation Distance (Feet): 110 Feet Assistive device: Rolling walker (2 wheeled) Gait Pattern/deviations: Step-to pattern Gait velocity: slow   General Gait Details: steady with RW in step to pattern  Stairs            Wheelchair Mobility    Modified Rankin (Stroke Patients Only)       Balance Overall balance assessment: No apparent balance deficits (not formally assessed)                                           Pertinent Vitals/Pain Pain Assessment: Faces Faces Pain Scale: Hurts even more Pain Location: R knee Pain Descriptors / Indicators: Burning;Aching Pain  Intervention(s): Premedicated before session    Home Living Family/patient expects to be discharged to:: Private residence Living Arrangements: Spouse/significant other Available Help at Discharge: Family;Available 24 hours/day Type of Home: House Home Access: Stairs to enter Entrance Stairs-Rails: Psychiatric nurse of Steps: flight Home Layout: Multi-level Home Equipment: Environmental consultant - 2 wheels;Cane - single point;Crutches;Bedside commode      Prior Function Level of Independence: Independent               Hand Dominance        Extremity/Trunk Assessment   Upper Extremity Assessment: Overall WFL for tasks assessed           Lower Extremity Assessment: Overall WFL for tasks assessed         Communication   Communication: No difficulties  Cognition Arousal/Alertness: Awake/alert Behavior During Therapy: WFL for tasks assessed/performed Overall Cognitive Status: Within Functional Limits for tasks assessed                      General Comments      Exercises Total Joint Exercises Ankle Circles/Pumps: AROM;Both;20 reps;Supine Quad Sets: AROM;Both;10 reps;Supine Heel Slides: AAROM;10 reps;Right Hip ABduction/ADduction: AROM;Right;10 reps;Supine Goniometric ROM: ~80*      Assessment/Plan    PT Assessment Patient needs continued PT services  PT Diagnosis Acute pain   PT Problem List Decreased strength;Decreased range of motion;Decreased activity tolerance;Decreased mobility;Decreased knowledge of use of DME;Pain  PT Treatment Interventions DME instruction;Gait training;Stair training;Functional mobility training;Therapeutic activities;Therapeutic exercise;Patient/family education   PT Goals (Current goals can be found in the Care Plan section) Acute Rehab PT Goals Patient Stated Goal: back independent and doing what I want to PT Goal Formulation: With patient Time For Goal Achievement: 04/28/14 Potential to Achieve Goals: Good     Frequency 7X/week   Barriers to discharge        Co-evaluation               End of Session Equipment Utilized During Treatment: Right knee immobilizer Activity Tolerance: Patient tolerated treatment well Patient left: in chair;with call bell/phone within reach;with family/visitor present Nurse Communication: Mobility status         Time: 2263-3354 PT Time Calculation (min) (ACUTE ONLY): 40 min   Charges:   PT Evaluation $Initial PT Evaluation Tier I: 1 Procedure PT Treatments $Gait Training: 8-22 mins $Therapeutic Exercise: 8-22 mins   PT G Codes:        Dianah Pruett, Tessie Fass 04/21/2014, 5:32 PM  04/21/2014  Donnella Sham, PT [phone]  (pager)

## 2014-04-21 NOTE — Brief Op Note (Signed)
04/21/2014  5:19 PM  PATIENT:  Danielle Rankin  69 y.o. male  PRE-OPERATIVE DIAGNOSIS:  DEGENERATIVE JOINT DISEASE, right knee  POST-OPERATIVE DIAGNOSIS:  DEGENERATIVE JOINT DISEASE, right knee  PROCEDURE:  Procedure(s): TOTAL KNEE ARTHROPLASTY (Right)  SURGEON:  Surgeon(s) and Role:    * Alta Corning, MD - Primary  PHYSICIAN ASSISTANT: bethune  ASSISTANTS: bethune   ANESTHESIA:   general  EBL:  Total I/O In: 1450 [P.O.:250; I.V.:1200] Out: 800 [Urine:400; Drains:200; Blood:200]  BLOOD ADMINISTERED:none  DRAINS: (r knee) Hemovact drain(s) in the r knee with  Suction Open   LOCAL MEDICATIONS USED:  MARCAINE     SPECIMEN:  No Specimen  DISPOSITION OF SPECIMEN:  N/A  COUNTS:  YES  TOURNIQUET:   Total Tourniquet Time Documented: Thigh (Right) - 65 minutes Total: Thigh (Right) - 65 minutes   DICTATION: .Other Dictation: Dictation Number [id number]  PLAN OF CARE: Admit to inpatient   PATIENT DISPOSITION:  PACU - hemodynamically stable.   Delay start of Pharmacological VTE agent (>24hrs) due to surgical blood loss or risk of bleeding: no

## 2014-04-21 NOTE — H&P (Signed)
TOTAL KNEE ADMISSION H&P  Patient is being admitted for right total knee arthroplasty.  Subjective:  Chief Complaint:right knee pain.  HPI: Scott Welch, 69 y.o. male, has a history of pain and functional disability in the right knee due to arthritis and has failed non-surgical conservative treatments for greater than 12 weeks to includeNSAID's and/or analgesics, corticosteriod injections, viscosupplementation injections, flexibility and strengthening excercises, use of assistive devices, weight reduction as appropriate and activity modification.  Onset of symptoms was gradual, starting 4 years ago with gradually worsening course since that time. The patient noted no past surgery on the right knee(s).  Patient currently rates pain in the right knee(s) at 8 out of 10 with activity. Patient has night pain, worsening of pain with activity and weight bearing, pain that interferes with activities of daily living, pain with passive range of motion, crepitus and joint swelling.  Patient has evidence of subchondral sclerosis, periarticular osteophytes, joint subluxation and joint space narrowing by imaging studies. This patient has had failure of conservative care. There is no active infection.  Patient Active Problem List   Diagnosis Date Noted  . Carotid artery stenosis 04/18/2014  . CAD (coronary artery disease), native coronary artery 02/22/2013    Class: Chronic  . Essential hypertension, benign 02/22/2013    Class: Chronic  . Hyperlipidemia 02/22/2013  . Obstructive sleep apnea 02/22/2013    Class: Chronic  . Osteoarthritis of left knee 02/22/2013    Class: Chronic   Past Medical History  Diagnosis Date  . Hypertension   . Depression   . Knee pain     followed by Dr. Berenice Primas at [facility]  . Coronary artery disease     one-vessel followed by Dr. Tamala Julian  . OSA on CPAP     since 1995. BIPAP  . Obesity   . Erectile dysfunction   . Osteoarthritis     left knee, Dr. Berenice Primas  .  Allergic rhinitis   . Plantar fasciitis   . Cervical radiculopathy     2008  . Acne   . DJD (degenerative joint disease)     Danyal Adorno  . Ear congestion     ear tubes, [place]  . Fractures     left elbow, right elbow, nose, multiple fingers, left clavicle    Past Surgical History  Procedure Laterality Date  . Coronary artery bypass graft      LIMA TO LAD  . Cardiac catheterization      drug-eluting stents in circumflev coronary artery, 2006  . Hernia repair    . Knee arthroscopy Bilateral   . Total knee arthroplasty Left 03/18/2013    Procedure: LEFT TOTAL KNEE ARTHROPLASTY;  Surgeon: Alta Corning, MD;  Location: [facility];  Service: Orthopedics;  Laterality: Left;  . Elbow surgery Bilateral     Compound fracture  . Tonsillectomy    . Nasal sinus surgery    . Tympanostomy tube placement      Prescriptions prior to admission  Medication Sig Dispense Refill Last Dose  . aspirin EC 81 MG tablet Take 81 mg by mouth daily.   04/20/2014 at Unknown time  . atorvastatin (LIPITOR) 20 MG tablet Take 20 mg by mouth daily.   04/21/2014 at 0630  . cetirizine (ZYRTEC) 10 MG tablet Take 10 mg by mouth daily.   04/21/2014 at 0630  . escitalopram (LEXAPRO) 10 MG tablet Take 10 mg by mouth daily.   04/21/2014 at 0630  . esomeprazole (NEXIUM) 20 MG capsule Take 20 mg  by mouth every morning.   04/21/2014 at 0630  . fluticasone (FLONASE) 50 MCG/ACT nasal spray Place 2 sprays into the nose daily as needed for allergies.    Past Week at Unknown time  . lisinopril (PRINIVIL,ZESTRIL) 20 MG tablet Take 20 mg by mouth daily.   04/21/2014 at 0630  . Menthol, Topical Analgesic, (ICY HOT EX) Apply 1 application topically daily as needed (applies for back pain).   Past Month at Unknown time  . methocarbamol (ROBAXIN) 500 MG tablet Take 1 tablet (500 mg total) by mouth every 6 (six) hours as needed for muscle spasms. 40 tablet 0 Past Month at Unknown time  . Misc Natural Products (OSTEO BI-FLEX TRIPLE STRENGTH PO) Take 1  tablet by mouth daily.    Past Week at Unknown time  . Multiple Vitamin (MULTIVITAMIN) tablet Take 1 tablet by mouth daily.   Past Week at Unknown time  . Omega-3 Fatty Acids (FISH OIL PO) Take 1 capsule by mouth daily.   Past Week at Unknown time  . oxyCODONE-acetaminophen (PERCOCET/ROXICET) 5-325 MG per tablet Take 1-2 tablets by mouth every 6 (six) hours as needed for moderate pain. 50 tablet 0 Past Month at Unknown time  . sildenafil (VIAGRA) 50 MG tablet Take 50 mg by mouth daily as needed for erectile dysfunction.   Taking   No Known Allergies  History  Substance Use Topics  . Smoking status: Never Smoker   . Smokeless tobacco: Never Used  . Alcohol Use: 1.2 oz/week    2 Glasses of wine per week     Comment: Daily    Family History  Problem Relation Age of Onset  . CAD Mother   . Dementia Mother   . CAD Father   . Hypertension Sister   . Diabetes Sister   . Gastric cancer Sister   . Glaucoma Brother      ROS ROS: I have reviewed the patient's review of systems thoroughly and there are no positive responses as relates to the HPI. Objective:  Physical Exam  Vital signs in last 24 hours: Pulse Rate:  [72] 72 (01/08 0845) Resp:  [18] 18 (01/08 0845) BP: (178)/(83) 178/83 mmHg (01/08 0845) SpO2:  [99 %] 99 % (01/08 0845) Weight:  [264 lb 6.4 oz (119.931 kg)] 264 lb 6.4 oz (119.931 kg) (01/08 0845) Well-developed well-nourished patient in no acute distress. Alert and oriented x3 HEENT:within normal limits Cardiac: Regular rate and rhythm Pulmonary: Lungs clear to auscultation Abdomen: Soft and nontender.  Normal active bowel sounds  Musculoskeletal: (R Knee: painful rom//rom0-95// no instability.  Jt line tender Labs: Recent Results (from the past 2160 hour(s))  Surgical pcr screen     Status: None   Collection Time: 04/10/14 11:34 AM  Result Value Ref Range   MRSA, PCR NEGATIVE NEGATIVE   Staphylococcus aureus NEGATIVE NEGATIVE    Comment:        The Xpert SA  Assay (FDA approved for NASAL specimens in patients over 38 years of age), is one component of a comprehensive surveillance program.  Test performance has been validated by [employer] for patients greater than or equal to 85 year old. It is not intended to diagnose infection nor to guide or monitor treatment.   Urinalysis, Routine w reflex microscopic     Status: None   Collection Time: 04/10/14 11:34 AM  Result Value Ref Range   Color, Urine YELLOW YELLOW   APPearance CLEAR CLEAR   Specific Gravity, Urine 1.021 1.005 - 1.030  pH 6.0 5.0 - 8.0   Glucose, UA NEGATIVE NEGATIVE mg/dL   Hgb urine dipstick NEGATIVE NEGATIVE   Bilirubin Urine NEGATIVE NEGATIVE   Ketones, ur NEGATIVE NEGATIVE mg/dL   Protein, ur NEGATIVE NEGATIVE mg/dL   Urobilinogen, UA 0.2 0.0 - 1.0 mg/dL   Nitrite NEGATIVE NEGATIVE   Leukocytes, UA NEGATIVE NEGATIVE    Comment: MICROSCOPIC NOT DONE ON URINES WITH NEGATIVE PROTEIN, BLOOD, LEUKOCYTES, NITRITE, OR GLUCOSE <1000 mg/dL.  APTT     Status: None   Collection Time: 04/10/14 11:44 AM  Result Value Ref Range   aPTT 27 24 - 37 seconds  CBC WITH DIFFERENTIAL     Status: None   Collection Time: 04/10/14 11:44 AM  Result Value Ref Range   WBC 4.3 4.0 - 10.5 K/uL   RBC 4.86 4.22 - 5.81 MIL/uL   Hemoglobin 14.9 13.0 - 17.0 g/dL   HCT 44.1 39.0 - 52.0 %   MCV 90.7 78.0 - 100.0 fL   MCH 30.7 26.0 - 34.0 pg   MCHC 33.8 30.0 - 36.0 g/dL   RDW 12.8 11.5 - 15.5 %   Platelets 184 150 - 400 K/uL   Neutrophils Relative % 61 43 - 77 %   Neutro Abs 2.6 1.7 - 7.7 K/uL   Lymphocytes Relative 27 12 - 46 %   Lymphs Abs 1.1 0.7 - 4.0 K/uL   Monocytes Relative 7 3 - 12 %   Monocytes Absolute 0.3 0.1 - 1.0 K/uL   Eosinophils Relative 4 0 - 5 %   Eosinophils Absolute 0.2 0.0 - 0.7 K/uL   Basophils Relative 1 0 - 1 %   Basophils Absolute 0.0 0.0 - 0.1 K/uL  Comprehensive metabolic panel     Status: Abnormal   Collection Time: 04/10/14 11:44 AM  Result Value Ref  Range   Sodium 140 135 - 145 mmol/L    Comment: Please note change in reference range.   Potassium 4.4 3.5 - 5.1 mmol/L    Comment: Please note change in reference range.   Chloride 106 96 - 112 mEq/L   CO2 28 19 - 32 mmol/L   Glucose, Bld 120 (H) 70 - 99 mg/dL   BUN 27 (H) 6 - 23 mg/dL   Creatinine, Ser 1.01 0.50 - 1.35 mg/dL   Calcium 9.3 8.4 - 10.5 mg/dL   Total Protein 6.6 6.0 - 8.3 g/dL   Albumin 4.0 3.5 - 5.2 g/dL   AST 22 0 - 37 U/L   ALT 23 0 - 53 U/L   Alkaline Phosphatase 68 39 - 117 U/L   Total Bilirubin 0.8 0.3 - 1.2 mg/dL   GFR calc non Af Amer 73 (L) >90 mL/min   GFR calc Af Amer 85 (L) >90 mL/min    Comment: (NOTE) The eGFR has been calculated using the CKD EPI equation. This calculation has not been validated in all clinical situations. eGFR's persistently <90 mL/min signify possible Chronic Kidney Disease.    Anion gap 6 5 - 15  Protime-INR     Status: None   Collection Time: 04/10/14 11:44 AM  Result Value Ref Range   Prothrombin Time 13.2 11.6 - 15.2 seconds   INR 0.99 0.00 - 1.49  Type and screen     Status: None   Collection Time: 04/10/14 11:45 AM  Result Value Ref Range   ABO/RH(D) A POS    Antibody Screen NEG    Sample Expiration 04/24/2014     Estimated body mass index  is 33.93 kg/(m^2) as calculated from the following:   Height as of 04/18/14: 6' 2" (1.88 m).   Weight as of this encounter: 264 lb 6.4 oz (119.931 kg).   Imaging Review Plain radiographs demonstrate severe degenerative joint disease of the right knee(s). The overall alignment ismild varus. The bone quality appears to be good for age and reported activity level.  Assessment/Plan:  End stage arthritis, right knee   The patient history, physical examination, clinical judgment of the provider and imaging studies are consistent with end stage degenerative joint disease of the right knee(s) and total knee arthroplasty is deemed medically necessary. The treatment options including  medical management, injection therapy arthroscopy and arthroplasty were discussed at length. The risks and benefits of total knee arthroplasty were presented and reviewed. The risks due to aseptic loosening, infection, stiffness, patella tracking problems, thromboembolic complications and other imponderables were discussed. The patient acknowledged the explanation, agreed to proceed with the plan and consent was signed. Patient is being admitted for inpatient treatment for surgery, pain control, PT, OT, prophylactic antibiotics, VTE prophylaxis, progressive ambulation and ADL's and discharge planning. The patient is planning to be discharged home with home health services

## 2014-04-21 NOTE — Progress Notes (Signed)
Utilization review completed.  

## 2014-04-21 NOTE — Anesthesia Preprocedure Evaluation (Signed)
Anesthesia Evaluation  Patient identified by MRN, date of birth, ID band Patient awake    Reviewed: Allergy & Precautions, NPO status , Unable to perform ROS - Chart review only  History of Anesthesia Complications Negative for: history of anesthetic complications  Airway Mallampati: II   Neck ROM: Full    Dental  (+) Teeth Intact   Pulmonary sleep apnea ,  breath sounds clear to auscultation        Cardiovascular hypertension, + CAD and + Peripheral Vascular Disease Rhythm:Regular Rate:Normal     Neuro/Psych  Neuromuscular disease    GI/Hepatic   Endo/Other    Renal/GU      Musculoskeletal  (+) Arthritis -,   Abdominal (+) + obese,   Peds  Hematology   Anesthesia Other Findings   Reproductive/Obstetrics                             Anesthesia Physical Anesthesia Plan  ASA: II  Anesthesia Plan: Spinal   Post-op Pain Management:    Induction: Intravenous  Airway Management Planned: Natural Airway and Simple Face Mask  Additional Equipment:   Intra-op Plan:   Post-operative Plan:   Informed Consent: I have reviewed the patients History and Physical, chart, labs and discussed the procedure including the risks, benefits and alternatives for the proposed anesthesia with the patient or authorized representative who has indicated his/her understanding and acceptance.     Plan Discussed with: CRNA and Surgeon  Anesthesia Plan Comments:         Anesthesia Quick Evaluation

## 2014-04-22 LAB — BASIC METABOLIC PANEL
ANION GAP: 4 — AB (ref 5–15)
BUN: 19 mg/dL (ref 6–23)
CHLORIDE: 105 meq/L (ref 96–112)
CO2: 25 mmol/L (ref 19–32)
Calcium: 8.3 mg/dL — ABNORMAL LOW (ref 8.4–10.5)
Creatinine, Ser: 1.07 mg/dL (ref 0.50–1.35)
GFR, EST AFRICAN AMERICAN: 79 mL/min — AB (ref >90)
GFR, EST NON AFRICAN AMERICAN: 68 mL/min — AB (ref >90)
Glucose, Bld: 157 mg/dL — ABNORMAL HIGH (ref 70–99)
POTASSIUM: 4 mmol/L (ref 3.5–5.1)
Sodium: 134 mmol/L — ABNORMAL LOW (ref 135–145)

## 2014-04-22 LAB — CBC
HEMATOCRIT: 36 % — AB (ref 39.0–52.0)
HEMOGLOBIN: 12 g/dL — AB (ref 13.0–17.0)
MCH: 30.3 pg (ref 26.0–34.0)
MCHC: 33.3 g/dL (ref 30.0–36.0)
MCV: 90.9 fL (ref 78.0–100.0)
PLATELETS: 162 10*3/uL (ref 150–400)
RBC: 3.96 MIL/uL — AB (ref 4.22–5.81)
RDW: 12.9 % (ref 11.5–15.5)
WBC: 7 10*3/uL (ref 4.0–10.5)

## 2014-04-22 MED ORDER — KETOROLAC TROMETHAMINE 15 MG/ML IJ SOLN
INTRAMUSCULAR | Status: AC
  Filled 2014-04-22: qty 1

## 2014-04-22 NOTE — Progress Notes (Signed)
Physical Therapy Treatment Patient Details Name: Scott Welch MRN: [id number] DOB: [birth year] Today's Date: 04/22/2014    History of Present Illness Pt admitted for elective TKA on the R.    PT Comments    Patient in CPM on arrival, spouse present.  Supervision for bed mobility, and gait.  Min assist to stand from low bed surface.  Stair training with [employer].  Bed mobility training problem solving to get into bed, utilized UE strength to pull legs into bed past knee, then rotate around in bed with Supervision assist.  Questions answered as needed regarding home barriers.  Patient and spouse feel ready to return home.  Patient will benefit from [facility] PT services on return home.  Will continue on PT schedule while in hospital, clear to DC when medically appropriate.  Follow Up Recommendations  Home health PT     Equipment Recommendations  None recommended by PT    Recommendations for Other Services       Precautions / Restrictions Precautions Precautions: Knee Required Braces or Orthoses:  (until quad stability) Knee Immobilizer - Right:  (Not used in PM, completes SLR < 10 degree lag) Restrictions Weight Bearing Restrictions: Yes RLE Weight Bearing: Weight bearing as tolerated    Mobility  Bed Mobility Overal bed mobility: Needs Assistance Bed Mobility: Supine to Sit;Sit to Supine     Supine to sit: Supervision Sit to supine: Supervision   General bed mobility comments: Cued to use UE to extend knee over matress, then rotate into bed  Transfers Overall transfer level: Needs assistance Equipment used: Rolling walker (2 wheeled)   Sit to Stand: Supervision         General transfer comment: cues for FWW use/position  Ambulation/Gait Ambulation/Gait assistance: Supervision Ambulation Distance (Feet): 200 Feet Assistive device: Rolling walker (2 wheeled) Gait Pattern/deviations: Step-to pattern;Step-through pattern;Trunk flexed Gait velocity: slow    General Gait Details: steady with RW in step to pattern; emerging step through   Stairs Stairs: Yes Stairs assistance: Min guard Stair Management: One rail Left;Step to pattern Number of Stairs: 9 General stair comments: initial cues for sequence only  Wheelchair Mobility    Modified Rankin (Stroke Patients Only)       Balance Overall balance assessment: No apparent balance deficits (not formally assessed)                                  Cognition Arousal/Alertness: Awake/alert Behavior During Therapy: WFL for tasks assessed/performed Overall Cognitive Status: Within Functional Limits for tasks assessed                      Exercises Total Joint Exercises Heel Slides: AAROM;10 reps;Right Hip ABduction/ADduction: AROM;Right;10 reps;Supine Straight Leg Raises: AROM;Right;10 reps;Supine    General Comments        Pertinent Vitals/Pain Pain Assessment: 0-10 Pain Score: 3  Pain Location: R knee    Home Living Family/patient expects to be discharged to:: Private residence Living Arrangements: Spouse/significant other Available Help at Discharge: Family;Available 24 hours/day Type of Home: House Home Access: Stairs to enter Entrance Stairs-Rails: Right;Left Home Layout: Multi-level Home Equipment: Environmental consultant - 2 wheels;Cane - single point;Crutches;Bedside commode      Prior Function Level of Independence: Independent          PT Goals (current goals can now be found in the care plan section) Acute Rehab PT Goals Patient Stated Goal: back independent  and doing what I want to PT Goal Formulation: With patient Time For Goal Achievement: 04/28/14 Potential to Achieve Goals: Good    Frequency  7X/week    PT Plan Current plan remains appropriate    Co-evaluation             End of Session Equipment Utilized During Treatment: Gait belt Activity Tolerance: Patient tolerated treatment well Patient left: with call bell/phone within  reach;in bed;in CPM;with family/visitor present     Time: 1510-1540 PT Time Calculation (min) (ACUTE ONLY): 30 min  Charges:  $Gait Training: 8-22 mins $Therapeutic Activity: 8-22 mins                    G Codes:      Whitman Meinhardt L May 11, 2014, 3:56 PM

## 2014-04-22 NOTE — Progress Notes (Addendum)
Subjective: 1 Day Post-Op Procedure(s) (LRB): TOTAL KNEE ARTHROPLASTY (Right) Patient reports pain as mild. Taking by mouth and voiding okay.   Objective: Vital signs in last 24 hours: Temp:  [97.5 F (36.4 C)-99 F (37.2 C)] 97.8 F (36.6 C) (01/09 0507) Pulse Rate:  [48-72] 68 (01/09 0507) Resp:  [12-20] 16 (01/09 0507) BP: (105-163)/(69-111) 105/92 mmHg (01/09 0507) SpO2:  [96 %-100 %] 98 % (01/09 0507)  Intake/Output from previous day: 01/08 0701 - 01/09 0700 In: 3301.7 [P.O.:970; I.V.:2231.7; IV Piggyback:100] Out: 1800 [Urine:800; Drains:800; Blood:200] Intake/Output this shift:     Recent Labs  04/22/14 0511  HGB 12.0*    Recent Labs  04/22/14 0511  WBC 7.0  RBC 3.96*  HCT 36.0*  PLT 162    Recent Labs  04/22/14 0511  NA 134*  K 4.0  CL 105  CO2 25  BUN 19  CREATININE 1.07  GLUCOSE 157*  CALCIUM 8.3*   No results for input(s): LABPT, INR in the last 72 hours. Right knee exam: Neurovascular intact Sensation intact distally Intact pulses distally Dorsiflexion/Plantar flexion intact Incision: dressing C/D/I Compartment soft  Assessment/Plan: 1 Day Post-Op Procedure(s) (LRB): TOTAL KNEE ARTHROPLASTY (Right) Plan: Continue aspirin 325 mg twice daily with SCDs for DVT prophylaxis. Up with therapy Plan for discharge tomorrow Hemovac drain pulled.  Gackle G 04/22/2014, 8:52 AM

## 2014-04-22 NOTE — Progress Notes (Signed)
Physical Therapy Treatment Patient Details Name: Scott Welch MRN: [id number] DOB: [birth year] Today's Date: 04/22/2014    History of Present Illness Pt admitted for elective TKA on the R.    PT Comments    Patient reports he has been up already this morning, agreeable to therapy, defer stairs till this afternoon.  Supervision for mobility with RW, slower, step to gait pattern, without significant distance limitation.  Demonstrates good range of motion in R knee, with strength as well.  Patient remains appropriate to continue with skilled PT services, need to further assess stairs ability/need.  Follow Up Recommendations  Home health PT     Equipment Recommendations  None recommended by PT    Recommendations for Other Services       Precautions / Restrictions Precautions Precautions: Knee Required Braces or Orthoses: Knee Immobilizer - Right (until quad stability) Restrictions Weight Bearing Restrictions: Yes RLE Weight Bearing: Weight bearing as tolerated    Mobility  Bed Mobility Overal bed mobility: Needs Assistance Bed Mobility: Supine to Sit     Supine to sit: Supervision        Transfers Overall transfer level: Needs assistance Equipment used: Rolling walker (2 wheeled)   Sit to Stand: Supervision         General transfer comment: cues for FWW use/position  Ambulation/Gait Ambulation/Gait assistance: Supervision Ambulation Distance (Feet): 175 Feet Assistive device: Rolling walker (2 wheeled) Gait Pattern/deviations: Step-to pattern;Antalgic;Narrow base of support Gait velocity: slow   General Gait Details: steady with RW in step to pattern   Stairs            Wheelchair Mobility    Modified Rankin (Stroke Patients Only)       Balance Overall balance assessment: No apparent balance deficits (not formally assessed)                                  Cognition Arousal/Alertness: Awake/alert Behavior During Therapy: WFL  for tasks assessed/performed Overall Cognitive Status: Within Functional Limits for tasks assessed                      Exercises Total Joint Exercises Ankle Circles/Pumps: AROM;Both;20 reps;Supine Heel Slides: AAROM;10 reps;Right Hip ABduction/ADduction: AROM;Right;10 reps;Supine Straight Leg Raises: AROM;Right;10 reps;Supine Long Arc Quad: AAROM;Right;10 reps;Seated Goniometric ROM: ~0-85*    General Comments        Pertinent Vitals/Pain Pain Assessment: 0-10 Pain Score: 4  Pain Location: R knee Pain Descriptors / Indicators: Burning;Aching Pain Intervention(s): Premedicated before session    Home Living                      Prior Function            PT Goals (current goals can now be found in the care plan section) Acute Rehab PT Goals Patient Stated Goal: back independent and doing what I want to PT Goal Formulation: With patient Time For Goal Achievement: 04/28/14 Potential to Achieve Goals: Good    Frequency  7X/week    PT Plan      Co-evaluation             End of Session Equipment Utilized During Treatment: Right knee immobilizer;Gait belt Activity Tolerance: Patient tolerated treatment well Patient left: in chair;with call bell/phone within reach     Time: 1000-1030 PT Time Calculation (min) (ACUTE ONLY): 30 min  Charges:  $Gait Training: 8-22 mins $  Therapeutic Exercise: 8-22 mins                    G Codes:      Greer Ee 05/21/2014, 10:38 AM

## 2014-04-22 NOTE — Op Note (Signed)
Scott Welch, Scott Welch NO.:  [id number]  MEDICAL RECORD NO.:  [id number]  LOCATION:  5N10C                        FACILITY:  [facility]  PHYSICIAN:  Alta Corning, M.D.   DATE OF BIRTH:  09-06-1945  DATE OF PROCEDURE:  04/21/2014 DATE OF DISCHARGE:                              OPERATIVE REPORT   PREOPERATIVE DIAGNOSIS:  End-stage degenerative joint disease, right knee.  POSTOPERATIVE DIAGNOSIS:  End-stage degenerative joint disease, right knee.  PROCEDURE:  Right total knee replacement with a Sigma system, size 6 femur, size 6 tibia, 12.5 mm bridging bearing, and 41 mm all polyethylene patella.  SURGEON:  Alta Corning, M.D.  ASSISTANT:  Gary Fleet, PA  ANESTHESIA:  General.  BRIEF HISTORY:  Scott Welch is a 69 year old male patient with a long history of significant complaints of right knee pain.  He had been treated conservatively for prolonged period of time.  He has had a previous left total knee replacement and done well.  His x-rays show bone-on-bone change, and he had failed conservative care and light activity pain and night pain; and after failure of all conservative care, he was taken to the operating room for right total knee replacement.  PROCEDURE IN DETAIL:  The patient was taken to the operating room and after adequate anesthesia was obtained with general anesthetic, the patient was placed on the operating table.  The right leg was then prepped and draped in usual sterile fashion.  Following this, the leg was exsanguinated and blood pressure tourniquet inflated to 350 mmHg. Following this, a midline incision was made in subcutaneous tissue down the level of extensor mechanism.  Medial parapatellar arthrotomy was undertaken.  Following this, the anterior and posterior cruciates were removed, medial and lateral meniscus, retropatellar fat pad, and the synovium in the anterior aspect of the femur.  Once this was excised, an extramedullary  tibial guide was used and the tibia was cut perpendicular to its long axis with an extramedullary guide.  Intramedullary pilot hole was drilled in the femur and a guide rod was placed and the femur was then cut perpendicular to its long axis and an 11 mm of distal femur was taken.  At this point, a spacer block was put in place, and we got easily into full extension.  At this point, attention was turned towards the femur where the anterior and posterior cuts were made, chamfers and box.  Attention turned towards the tibia which was drilled and keeled to a size 6.  Trials were put in place.  The 10 mm bridging bearing trial was placed and easy full extension.  A little bit loose in flexion and extension, so we went ahead and put in a 12.5 which gave [place] excellent range of motion and stability.  Attention turned to the patella, cut down to a level of 13 mm and a 41 all poly patella was placed.  At this point, knee put through a range of motion, excellent stability and range of motion was achieved.  At this point, the trials were removed.  The knee was thoroughly lavaged with pulsatile lavage, irrigation and suction dry.  The final components were cemented in place.  Size 6 tibia, size 6 femur, 12.5 mm bridging bearing, and a 41 mm all polyethylene patella.  Once this was completed, all bone cement was allowed to harden and all excess cement was removed.  At this time, the tourniquet was let down.  All bleeding was controlled with electrocautery.  A 60 mL of Exparel and Marcaine mixture were put around the knee for postoperative anesthesia.  The patient then had the final polyethylene spacer placed.  A medium Hemovac drain was placed.  Prior to placement of the poly, 2 g of tranexamic acid and saline were placed into the knee and held for 2 minutes and then this was removed.  At this point, the final poly was placed.  Again knee checked for stability and range of motion, were excellent.  The  medial parapatellar arthrotomy was closed with 1 Vicryl running, skin with 0 and 2-0 Vicryl and a 3-0 Monocryl subcuticular.  Benzoin and Steri-Strips applied.  Sterile compressive dressing was applied.  The patient was taken to recovery room and noted to be in satisfactory condition.  Estimated blood loss for the procedure was minimal.     Alta Corning, M.D.     Corliss Skains  D:  04/21/2014  T:  04/22/2014  Job:  [id number]

## 2014-04-22 NOTE — Progress Notes (Signed)
Orthopedic Tech Progress Note Patient Details:  Scott Welch 1945-06-10 [id number]  Patient ID: Scott Welch, male   DOB: 1946/02/15, 69 y.o.   MRN: [id number] Placed pt's rle in cpm @0 -70 degrees @1400   Hildred Priest 04/22/2014, 2:28 PM

## 2014-04-22 NOTE — Plan of Care (Signed)
Problem: Consults Goal: Diagnosis- Total Joint Replacement Primary Total Knee     

## 2014-04-23 LAB — CBC
HEMATOCRIT: 33.3 % — AB (ref 39.0–52.0)
Hemoglobin: 11.1 g/dL — ABNORMAL LOW (ref 13.0–17.0)
MCH: 30.2 pg (ref 26.0–34.0)
MCHC: 33.3 g/dL (ref 30.0–36.0)
MCV: 90.5 fL (ref 78.0–100.0)
PLATELETS: 154 10*3/uL (ref 150–400)
RBC: 3.68 MIL/uL — AB (ref 4.22–5.81)
RDW: 12.9 % (ref 11.5–15.5)
WBC: 7.9 10*3/uL (ref 4.0–10.5)

## 2014-04-23 NOTE — Discharge Summary (Signed)
Patient ID: Scott Welch MRN: [id number] DOB/AGE: 1945-05-08 69 y.o.  Admit date: 04/21/2014 Discharge date: 04/23/2014  Admission Diagnoses:  Principal Problem:   Primary osteoarthritis of right knee   Discharge Diagnoses:  Same  Past Medical History  Diagnosis Date  . Hypertension   . Depression   . Knee pain     followed by Dr. Berenice Primas at [facility]  . Coronary artery disease     one-vessel followed by Dr. Tamala Julian  . OSA on CPAP     since 1995. BIPAP  . Obesity   . Erectile dysfunction   . Osteoarthritis     left knee, Dr. Berenice Primas  . Allergic rhinitis   . Plantar fasciitis   . Cervical radiculopathy     2008  . Acne   . DJD (degenerative joint disease)     [place]  . Ear congestion     ear tubes, [place]  . Fractures     left elbow, right elbow, nose, multiple fingers, left clavicle    Surgeries: Procedure(s): Right TOTAL KNEE ARTHROPLASTY on 04/21/2014    Discharged Condition: Improved  Hospital Course: Scott Welch is an 69 y.o. male who was admitted 04/21/2014 for operative treatment ofPrimary osteoarthritis of right knee. Patient has severe unremitting pain that affects sleep, daily activities, and work/hobbies. After pre-op clearance the patient was taken to the operating room on 04/21/2014 and underwent  Procedure(s): Right TOTAL KNEE ARTHROPLASTY.    Patient was given perioperative antibiotics: Anti-infectives    Start     Dose/Rate Route Frequency Ordered Stop   04/21/14 1600  ceFAZolin (ANCEF) IVPB 2 g/50 mL premix     2 g100 mL/hr over 30 Minutes Intravenous Every 6 hours 04/21/14 1506 04/21/14 2212   04/21/14 0600  ceFAZolin (ANCEF) IVPB 2 g/50 mL premix  Status:  Discontinued     2 g100 mL/hr over 30 Minutes Intravenous On call to O.R. 04/20/14 1428 04/20/14 1429   04/21/14 0600  ceFAZolin (ANCEF) 3 g in dextrose 5 % 50 mL IVPB     3 g160 mL/hr over 30 Minutes Intravenous On call to O.R. 04/20/14 1429 04/21/14 1001       Patient was given  sequential compression devices, early ambulation, and chemoprophylaxis to prevent DVT. His Hemovac drain was pulled on postoperative day #1. His dressing was changed on postoperative day #2.  Patient benefited maximally from hospital stay and there were no complications.    Recent vital signs: Patient Vitals for the past 24 hrs:  BP Temp Temp src Pulse Resp SpO2  04/23/14 0535 (!) 154/64 mmHg 98.4 F (36.9 C) Oral 75 18 92 %  04/22/14 2028 (!) 160/68 mmHg 99.5 F (37.5 C) Oral 74 16 96 %  04/22/14 1552 (!) 167/79 mmHg 97.8 F (36.6 C) Oral 78 16 92 %     Recent laboratory studies:  Recent Labs  04/22/14 0511 04/23/14 0457  WBC 7.0 7.9  HGB 12.0* 11.1*  HCT 36.0* 33.3*  PLT 162 154  NA 134*  --   K 4.0  --   CL 105  --   CO2 25  --   BUN 19  --   CREATININE 1.07  --   GLUCOSE 157*  --   CALCIUM 8.3*  --      Discharge Medications:     Medication List    TAKE these medications        aspirin EC 325 MG tablet  Take 1 tablet (325 mg total)  by mouth 2 (two) times daily after a meal.     atorvastatin 20 MG tablet  Commonly known as:  LIPITOR  Take 20 mg by mouth daily.     cetirizine 10 MG tablet  Commonly known as:  ZYRTEC  Take 10 mg by mouth daily.     escitalopram 10 MG tablet  Commonly known as:  LEXAPRO  Take 10 mg by mouth daily.     FISH OIL PO  Take 1 capsule by mouth daily.     fluticasone 50 MCG/ACT nasal spray  Commonly known as:  FLONASE  Place 2 sprays into the nose daily as needed for allergies.     ICY HOT EX  Apply 1 application topically daily as needed (applies for back pain).     lisinopril 20 MG tablet  Commonly known as:  PRINIVIL,ZESTRIL  Take 20 mg by mouth daily.     methocarbamol 500 MG tablet  Commonly known as:  ROBAXIN  Take 1 tablet (500 mg total) by mouth every 8 (eight) hours as needed for muscle spasms.     multivitamin tablet  Take 1 tablet by mouth daily.     NEXIUM 20 MG capsule  Generic drug:  esomeprazole   Take 20 mg by mouth every morning.     OSTEO BI-FLEX TRIPLE STRENGTH PO  Take 1 tablet by mouth daily.     oxyCODONE-acetaminophen 5-325 MG per tablet  Commonly known as:  PERCOCET/ROXICET  Take 1-2 tablets by mouth every 4 (four) hours as needed for severe pain.     sildenafil 50 MG tablet  Commonly known as:  VIAGRA  Take 50 mg by mouth daily as needed for erectile dysfunction.        Diagnostic Studies: Dg Chest 2 View  04/10/2014   CLINICAL DATA:  Preop testing.  EXAM: CHEST  2 VIEW  COMPARISON:  03/08/2013  FINDINGS: Prior CABG. Heart and mediastinal contours are within normal limits. No focal opacities or effusions. No acute bony abnormality. Degenerative changes in the thoracic spine.  IMPRESSION: No active cardiopulmonary disease.   Electronically Signed   By: Rolm Baptise M.D.   On: 04/10/2014 12:20    Disposition: 01-Home or Self Care      Discharge Instructions    CPM    Complete by:  As directed   Continuous passive motion machine (CPM):      Use the CPM from 0 to 60 for 8 hours per day.      You may increase by 5-10 per day.  You may break it up into 2 or 3 sessions per day.      Use CPM for 1-2 weeks or until you are told to stop.     Call MD / Call 911    Complete by:  As directed   If you experience chest pain or shortness of breath, CALL 911 and be transported to the hospital emergency room.  If you develope a fever above 101 F, pus (white drainage) or increased drainage or redness at the wound, or calf pain, call your surgeon's office.     Constipation Prevention    Complete by:  As directed   Drink plenty of fluids.  Prune juice may be helpful.  You may use a stool softener, such as Colace (over the counter) 100 mg twice a day.  Use MiraLax (over the counter) for constipation as needed.     Diet general    Complete by:  As directed  Do not put a pillow under the knee. Place it under the heel.    Complete by:  As directed      Face-to-face  encounter (required for Medicare/Medicaid patients)    Complete by:  As directed   I Clemence Lengyel G certify that this patient is under my care and that I, or a nurse practitioner or physician's assistant working with me, had a face-to-face encounter that meets the physician face-to-face encounter requirements with this patient on 04/23/2014. The encounter with the patient was in whole, or in part for the following medical condition(s) which is the primary reason for home health care (List medical condition): Primary osteoarthritis right knee status post right total knee replacement  The encounter with the patient was in whole, or in part, for the following medical condition, which is the primary reason for home health care:  Primary osteoarthritis right knee status post right total knee replacement  I certify that, based on my findings, the following services are medically necessary home health services:  Physical therapy  Reason for Medically Necessary Home Health Services:   Therapy- Personnel officer, Training and development officer and Stair TrainingTherapy- Therapeutic Exercises to Increase Strength and Endurance    My clinical findings support the need for the above services:   Pain interferes with ambulation/mobilityUnable to leave home safely without assistance and/or assistive device    Further, I certify that my clinical findings support that this patient is homebound due to:  Unable to leave home safely without assistance     Home Health    Complete by:  As directed   To provide the following care/treatments:  PT     Increase activity slowly as tolerated    Complete by:  As directed      Weight bearing as tolerated    Complete by:  As directed   Laterality:  right  Extremity:  Lower     Weight bearing as tolerated    Complete by:  As directed   Laterality:  left  Extremity:  Lower           Follow-up Information    Follow up with GRAVES,JOHN L, MD. Schedule an appointment as soon as possible for  a visit in 2 weeks.   Specialty:  Orthopedic Surgery   Contact information:   [place] [phone]        Signed: Erlene Senters 04/23/2014, 11:43 AM

## 2014-04-23 NOTE — Progress Notes (Signed)
Physical Therapy Treatment Patient Details Name: MEDARDO HASSING MRN: [id number] DOB: 01-26-1946 Today's Date: 04/23/2014    History of Present Illness Pt admitted for elective TKA on the R.    PT Comments    Excellent progress with PT; OK for dc home from PT standpoint   Follow Up Recommendations  Home health PT     Equipment Recommendations  None recommended by PT    Recommendations for Other Services       Precautions / Restrictions Precautions Precautions: Knee Precaution Comments: Pt educated to not allow any pillow or bolster under knee for healing with optimal range of motion. Restrictions RLE Weight Bearing: Weight bearing as tolerated    Mobility  Bed Mobility Overal bed mobility: Needs Assistance Bed Mobility: Sit to Supine       Sit to supine: Supervision   General bed mobility comments: Cued to use UE to extend knee over matress, then rotate into bed  Transfers Overall transfer level: Needs assistance Equipment used: Rolling walker (2 wheeled) Transfers: Sit to/from Stand Sit to Stand: Supervision         General transfer comment: cues for technique  Ambulation/Gait Ambulation/Gait assistance: Supervision Ambulation Distance (Feet): 200 Feet Assistive device: Rolling walker (2 wheeled) Gait Pattern/deviations: Step-through pattern Gait velocity: slow   General Gait Details: steady with RW in step to pattern; emerging step through   Stairs Stairs: Yes Stairs assistance: Min guard Stair Management: One rail Right;Step to pattern;Sideways Number of Stairs: 10 General stair comments: initial cues for sequence only  Wheelchair Mobility    Modified Rankin (Stroke Patients Only)       Balance Overall balance assessment: No apparent balance deficits (not formally assessed)                                  Cognition Arousal/Alertness: Awake/alert Behavior During Therapy: WFL for tasks assessed/performed Overall  Cognitive Status: Within Functional Limits for tasks assessed                      Exercises Total Joint Exercises Quad Sets: AROM;20 reps;Right;Supine Heel Slides: AAROM;10 reps;Right Straight Leg Raises: AROM;Right;10 reps;Supine    General Comments        Pertinent Vitals/Pain Pain Assessment: 0-10 Pain Score: 4  Pain Location: R knee Pain Descriptors / Indicators: Aching Pain Intervention(s): Monitored during session;Repositioned    Home Living                      Prior Function            PT Goals (current goals can now be found in the care plan section) Acute Rehab PT Goals Patient Stated Goal: back independent and doing what I want to PT Goal Formulation: With patient Time For Goal Achievement: 04/28/14 Potential to Achieve Goals: Good Progress towards PT goals: Progressing toward goals    Frequency  7X/week    PT Plan Current plan remains appropriate    Co-evaluation             End of Session Equipment Utilized During Treatment: Gait belt Activity Tolerance: Patient tolerated treatment well Patient left: in bed;with call bell/phone within reach     Time: 0805-0835 PT Time Calculation (min) (ACUTE ONLY): 30 min  Charges:  $Gait Training: 8-22 mins $Therapeutic Exercise: 8-22 mins  G Codes:      Roney Marion Hamff 04/23/2014, 1:16 PM  Roney Marion, [place]  Acute Rehabilitation Services Pager [phone] Office [phone]

## 2014-04-23 NOTE — Progress Notes (Signed)
Subjective: 2 Days Post-Op Procedure(s) (LRB): TOTAL KNEE ARTHROPLASTY (Right) Patient reports pain as mild.  Taking by mouth and voiding okay. Good progress with physical therapy. Positive flatus.  Objective: Vital signs in last 24 hours: Temp:  [97.8 F (36.6 C)-99.5 F (37.5 C)] 98.4 F (36.9 C) (01/10 0535) Pulse Rate:  [74-78] 75 (01/10 0535) Resp:  [16-18] 18 (01/10 0535) BP: (154-167)/(64-79) 154/64 mmHg (01/10 0535) SpO2:  [92 %-96 %] 92 % (01/10 0535)  Intake/Output from previous day: 01/09 0701 - 01/10 0700 In: 960 [P.O.:960] Out: 400 [Urine:400] Intake/Output this shift:     Recent Labs  04/22/14 0511 04/23/14 0457  HGB 12.0* 11.1*    Recent Labs  04/22/14 0511 04/23/14 0457  WBC 7.0 7.9  RBC 3.96* 3.68*  HCT 36.0* 33.3*  PLT 162 154    Recent Labs  04/22/14 0511  NA 134*  K 4.0  CL 105  CO2 25  BUN 19  CREATININE 1.07  GLUCOSE 157*  CALCIUM 8.3*   No results for input(s): LABPT, INR in the last 72 hours. Right knee exam: Neurovascular intact Sensation intact distally Intact pulses distally Dorsiflexion/Plantar flexion intact Incision: dressing C/D/I Compartment soft  Assessment/Plan: 2 Days Post-Op Procedure(s) (LRB): TOTAL KNEE ARTHROPLASTY (Right) Plan: Dressing changed. Discharge home with home health Follow-up with Dr. Berenice Primas in 2 weeks.  Caprice Wasko G 04/23/2014, 11:35 AM

## 2014-04-23 NOTE — Progress Notes (Signed)
Case manager spoke with Jeneen Rinks [facility] rep re order for 3 in 1 Bedside commode.Rep will deliver to patients room.

## 2014-04-23 NOTE — Care Management Note (Signed)
    Page 1 of 2   04/23/2014     11:21:03 AM CARE MANAGEMENT NOTE 04/23/2014  Patient:  Scott Welch, Scott Welch   Account Number:  [id number]  Date Initiated:  04/22/2014  Documentation initiated by:  [facility]  Subjective/Objective Assessment:   TOTAL KNEE ARTHROPLASTY     Action/Plan:   Case Manager consult.   Anticipated DC Date:  04/23/2014   Anticipated DC Plan:  [place]  CM consult      [facility] Choice  HOME HEALTH  DURABLE MEDICAL EQUIPMENT   Choice offered to / List presented to:     DME arranged  3-N-1  [place]  CPM      DME agency  [employer]     [place] arranged  HH-2 PT      [place].   Status of service:  Completed, signed off Medicare Important Message given?   (If response is "NO", the following Medicare IM given date fields will be blank) Date Medicare IM given:   Medicare IM given by:   Date Additional Medicare IM given:   Additional Medicare IM given by:    Discharge Disposition:    Per UR Regulation:    If discussed at Long Length of Stay Meetings, dates discussed:    Comments:    1.10.2015  Scott Welch BSN.Case Manager 11am Met patient at bedside role of Case Manager explained.Patient reports his understanding.Patient provided with CHOICE list for Home health needs and elects [facility] PT-.Referal called to [place] [facility].Patients Demographics verified in EPIC.Patient reports he does not have a 3 in 1.DME ( Consult called to [place] home care rep.Patient verbalizes his understanding of Discharge plan.No further CM needs.Patients primary nurse aware of discharge plan.                  04/22/2014 1250 Preoperatively set up with [facility] for [facility]. Scott Finner RN CCM Case Mgmt phone [phone]

## 2014-04-24 ENCOUNTER — Encounter (HOSPITAL_COMMUNITY): Payer: Self-pay | Admitting: Orthopedic Surgery

## 2014-04-24 DIAGNOSIS — G4733 Obstructive sleep apnea (adult) (pediatric): Secondary | ICD-10-CM | POA: Diagnosis not present

## 2014-04-24 DIAGNOSIS — Z96651 Presence of right artificial knee joint: Secondary | ICD-10-CM | POA: Diagnosis not present

## 2014-04-24 DIAGNOSIS — I2510 Atherosclerotic heart disease of native coronary artery without angina pectoris: Secondary | ICD-10-CM | POA: Diagnosis not present

## 2014-04-24 DIAGNOSIS — E669 Obesity, unspecified: Secondary | ICD-10-CM | POA: Diagnosis not present

## 2014-04-24 DIAGNOSIS — I10 Essential (primary) hypertension: Secondary | ICD-10-CM | POA: Diagnosis not present

## 2014-04-24 DIAGNOSIS — M25561 Pain in right knee: Secondary | ICD-10-CM | POA: Diagnosis not present

## 2014-04-24 DIAGNOSIS — Z471 Aftercare following joint replacement surgery: Secondary | ICD-10-CM | POA: Diagnosis not present

## 2014-04-24 DIAGNOSIS — M1990 Unspecified osteoarthritis, unspecified site: Secondary | ICD-10-CM | POA: Diagnosis not present

## 2014-04-24 DIAGNOSIS — R2689 Other abnormalities of gait and mobility: Secondary | ICD-10-CM | POA: Diagnosis not present

## 2014-04-24 DIAGNOSIS — Z96652 Presence of left artificial knee joint: Secondary | ICD-10-CM | POA: Diagnosis not present

## 2014-04-26 ENCOUNTER — Telehealth: Payer: Self-pay

## 2014-04-26 DIAGNOSIS — Z471 Aftercare following joint replacement surgery: Secondary | ICD-10-CM | POA: Diagnosis not present

## 2014-04-26 DIAGNOSIS — I2510 Atherosclerotic heart disease of native coronary artery without angina pectoris: Secondary | ICD-10-CM | POA: Diagnosis not present

## 2014-04-26 DIAGNOSIS — Z96651 Presence of right artificial knee joint: Secondary | ICD-10-CM | POA: Diagnosis not present

## 2014-04-26 DIAGNOSIS — R2689 Other abnormalities of gait and mobility: Secondary | ICD-10-CM | POA: Diagnosis not present

## 2014-04-26 DIAGNOSIS — M25561 Pain in right knee: Secondary | ICD-10-CM | POA: Diagnosis not present

## 2014-04-26 DIAGNOSIS — I6523 Occlusion and stenosis of bilateral carotid arteries: Secondary | ICD-10-CM

## 2014-04-26 DIAGNOSIS — I10 Essential (primary) hypertension: Secondary | ICD-10-CM | POA: Diagnosis not present

## 2014-04-26 NOTE — Telephone Encounter (Signed)
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 Repeat in one year.

## 2014-04-28 DIAGNOSIS — I2510 Atherosclerotic heart disease of native coronary artery without angina pectoris: Secondary | ICD-10-CM | POA: Diagnosis not present

## 2014-04-28 DIAGNOSIS — Z471 Aftercare following joint replacement surgery: Secondary | ICD-10-CM | POA: Diagnosis not present

## 2014-04-28 DIAGNOSIS — Z96651 Presence of right artificial knee joint: Secondary | ICD-10-CM | POA: Diagnosis not present

## 2014-04-28 DIAGNOSIS — R2689 Other abnormalities of gait and mobility: Secondary | ICD-10-CM | POA: Diagnosis not present

## 2014-04-28 DIAGNOSIS — I10 Essential (primary) hypertension: Secondary | ICD-10-CM | POA: Diagnosis not present

## 2014-04-28 DIAGNOSIS — M25561 Pain in right knee: Secondary | ICD-10-CM | POA: Diagnosis not present

## 2014-05-01 DIAGNOSIS — I10 Essential (primary) hypertension: Secondary | ICD-10-CM | POA: Diagnosis not present

## 2014-05-01 DIAGNOSIS — R2689 Other abnormalities of gait and mobility: Secondary | ICD-10-CM | POA: Diagnosis not present

## 2014-05-01 DIAGNOSIS — Z96651 Presence of right artificial knee joint: Secondary | ICD-10-CM | POA: Diagnosis not present

## 2014-05-01 DIAGNOSIS — I2510 Atherosclerotic heart disease of native coronary artery without angina pectoris: Secondary | ICD-10-CM | POA: Diagnosis not present

## 2014-05-01 DIAGNOSIS — M25561 Pain in right knee: Secondary | ICD-10-CM | POA: Diagnosis not present

## 2014-05-01 DIAGNOSIS — Z471 Aftercare following joint replacement surgery: Secondary | ICD-10-CM | POA: Diagnosis not present

## 2014-05-03 DIAGNOSIS — Z96651 Presence of right artificial knee joint: Secondary | ICD-10-CM | POA: Diagnosis not present

## 2014-05-03 DIAGNOSIS — I2510 Atherosclerotic heart disease of native coronary artery without angina pectoris: Secondary | ICD-10-CM | POA: Diagnosis not present

## 2014-05-03 DIAGNOSIS — I10 Essential (primary) hypertension: Secondary | ICD-10-CM | POA: Diagnosis not present

## 2014-05-03 DIAGNOSIS — Z471 Aftercare following joint replacement surgery: Secondary | ICD-10-CM | POA: Diagnosis not present

## 2014-05-03 DIAGNOSIS — R2689 Other abnormalities of gait and mobility: Secondary | ICD-10-CM | POA: Diagnosis not present

## 2014-05-03 DIAGNOSIS — M25561 Pain in right knee: Secondary | ICD-10-CM | POA: Diagnosis not present

## 2014-05-04 DIAGNOSIS — Z471 Aftercare following joint replacement surgery: Secondary | ICD-10-CM | POA: Diagnosis not present

## 2014-05-04 DIAGNOSIS — M25561 Pain in right knee: Secondary | ICD-10-CM | POA: Diagnosis not present

## 2014-05-04 DIAGNOSIS — I10 Essential (primary) hypertension: Secondary | ICD-10-CM | POA: Diagnosis not present

## 2014-05-04 DIAGNOSIS — I2510 Atherosclerotic heart disease of native coronary artery without angina pectoris: Secondary | ICD-10-CM | POA: Diagnosis not present

## 2014-05-04 DIAGNOSIS — Z96651 Presence of right artificial knee joint: Secondary | ICD-10-CM | POA: Diagnosis not present

## 2014-05-04 DIAGNOSIS — R2689 Other abnormalities of gait and mobility: Secondary | ICD-10-CM | POA: Diagnosis not present

## 2014-05-08 DIAGNOSIS — Z9889 Other specified postprocedural states: Secondary | ICD-10-CM | POA: Diagnosis not present

## 2014-05-09 DIAGNOSIS — M25561 Pain in right knee: Secondary | ICD-10-CM | POA: Diagnosis not present

## 2014-05-09 DIAGNOSIS — I2510 Atherosclerotic heart disease of native coronary artery without angina pectoris: Secondary | ICD-10-CM | POA: Diagnosis not present

## 2014-05-09 DIAGNOSIS — Z96651 Presence of right artificial knee joint: Secondary | ICD-10-CM | POA: Diagnosis not present

## 2014-05-09 DIAGNOSIS — I10 Essential (primary) hypertension: Secondary | ICD-10-CM | POA: Diagnosis not present

## 2014-05-09 DIAGNOSIS — R2689 Other abnormalities of gait and mobility: Secondary | ICD-10-CM | POA: Diagnosis not present

## 2014-05-09 DIAGNOSIS — Z471 Aftercare following joint replacement surgery: Secondary | ICD-10-CM | POA: Diagnosis not present

## 2014-05-11 DIAGNOSIS — M25561 Pain in right knee: Secondary | ICD-10-CM | POA: Diagnosis not present

## 2014-05-11 DIAGNOSIS — Z471 Aftercare following joint replacement surgery: Secondary | ICD-10-CM | POA: Diagnosis not present

## 2014-05-11 DIAGNOSIS — I10 Essential (primary) hypertension: Secondary | ICD-10-CM | POA: Diagnosis not present

## 2014-05-11 DIAGNOSIS — R2689 Other abnormalities of gait and mobility: Secondary | ICD-10-CM | POA: Diagnosis not present

## 2014-05-11 DIAGNOSIS — I2510 Atherosclerotic heart disease of native coronary artery without angina pectoris: Secondary | ICD-10-CM | POA: Diagnosis not present

## 2014-05-11 DIAGNOSIS — Z96651 Presence of right artificial knee joint: Secondary | ICD-10-CM | POA: Diagnosis not present

## 2014-05-15 DIAGNOSIS — M25661 Stiffness of right knee, not elsewhere classified: Secondary | ICD-10-CM | POA: Diagnosis not present

## 2014-05-15 DIAGNOSIS — M25561 Pain in right knee: Secondary | ICD-10-CM | POA: Diagnosis not present

## 2014-05-15 DIAGNOSIS — Z96651 Presence of right artificial knee joint: Secondary | ICD-10-CM | POA: Diagnosis not present

## 2014-05-17 DIAGNOSIS — M25561 Pain in right knee: Secondary | ICD-10-CM | POA: Diagnosis not present

## 2014-05-17 DIAGNOSIS — M25661 Stiffness of right knee, not elsewhere classified: Secondary | ICD-10-CM | POA: Diagnosis not present

## 2014-05-17 DIAGNOSIS — Z96651 Presence of right artificial knee joint: Secondary | ICD-10-CM | POA: Diagnosis not present

## 2014-05-24 DIAGNOSIS — M25561 Pain in right knee: Secondary | ICD-10-CM | POA: Diagnosis not present

## 2014-05-24 DIAGNOSIS — Z96651 Presence of right artificial knee joint: Secondary | ICD-10-CM | POA: Diagnosis not present

## 2014-05-24 DIAGNOSIS — M25661 Stiffness of right knee, not elsewhere classified: Secondary | ICD-10-CM | POA: Diagnosis not present

## 2014-05-26 DIAGNOSIS — M25661 Stiffness of right knee, not elsewhere classified: Secondary | ICD-10-CM | POA: Diagnosis not present

## 2014-05-26 DIAGNOSIS — M25561 Pain in right knee: Secondary | ICD-10-CM | POA: Diagnosis not present

## 2014-05-26 DIAGNOSIS — Z96651 Presence of right artificial knee joint: Secondary | ICD-10-CM | POA: Diagnosis not present

## 2014-05-31 DIAGNOSIS — M25661 Stiffness of right knee, not elsewhere classified: Secondary | ICD-10-CM | POA: Diagnosis not present

## 2014-05-31 DIAGNOSIS — M25561 Pain in right knee: Secondary | ICD-10-CM | POA: Diagnosis not present

## 2014-05-31 DIAGNOSIS — Z96651 Presence of right artificial knee joint: Secondary | ICD-10-CM | POA: Diagnosis not present

## 2014-06-01 DIAGNOSIS — Z9889 Other specified postprocedural states: Secondary | ICD-10-CM | POA: Diagnosis not present

## 2014-06-05 DIAGNOSIS — Z96651 Presence of right artificial knee joint: Secondary | ICD-10-CM | POA: Diagnosis not present

## 2014-06-05 DIAGNOSIS — M25561 Pain in right knee: Secondary | ICD-10-CM | POA: Diagnosis not present

## 2014-06-05 DIAGNOSIS — M25661 Stiffness of right knee, not elsewhere classified: Secondary | ICD-10-CM | POA: Diagnosis not present

## 2014-06-07 DIAGNOSIS — Z96651 Presence of right artificial knee joint: Secondary | ICD-10-CM | POA: Diagnosis not present

## 2014-06-07 DIAGNOSIS — M25661 Stiffness of right knee, not elsewhere classified: Secondary | ICD-10-CM | POA: Diagnosis not present

## 2014-06-07 DIAGNOSIS — M25561 Pain in right knee: Secondary | ICD-10-CM | POA: Diagnosis not present

## 2014-06-12 DIAGNOSIS — M25661 Stiffness of right knee, not elsewhere classified: Secondary | ICD-10-CM | POA: Diagnosis not present

## 2014-06-12 DIAGNOSIS — M25561 Pain in right knee: Secondary | ICD-10-CM | POA: Diagnosis not present

## 2014-06-12 DIAGNOSIS — Z96651 Presence of right artificial knee joint: Secondary | ICD-10-CM | POA: Diagnosis not present

## 2014-06-14 DIAGNOSIS — Z96651 Presence of right artificial knee joint: Secondary | ICD-10-CM | POA: Diagnosis not present

## 2014-06-14 DIAGNOSIS — M25661 Stiffness of right knee, not elsewhere classified: Secondary | ICD-10-CM | POA: Diagnosis not present

## 2014-06-14 DIAGNOSIS — M25561 Pain in right knee: Secondary | ICD-10-CM | POA: Diagnosis not present

## 2014-07-11 DIAGNOSIS — Z9889 Other specified postprocedural states: Secondary | ICD-10-CM | POA: Diagnosis not present

## 2014-09-19 DIAGNOSIS — Z96652 Presence of left artificial knee joint: Secondary | ICD-10-CM | POA: Diagnosis not present

## 2014-09-19 DIAGNOSIS — Z09 Encounter for follow-up examination after completed treatment for conditions other than malignant neoplasm: Secondary | ICD-10-CM | POA: Diagnosis not present

## 2014-09-19 DIAGNOSIS — Z96651 Presence of right artificial knee joint: Secondary | ICD-10-CM | POA: Diagnosis not present

## 2014-09-21 DIAGNOSIS — R600 Localized edema: Secondary | ICD-10-CM | POA: Diagnosis not present

## 2014-09-22 ENCOUNTER — Other Ambulatory Visit: Payer: Self-pay | Admitting: Internal Medicine

## 2014-09-22 DIAGNOSIS — I8290 Acute embolism and thrombosis of unspecified vein: Secondary | ICD-10-CM

## 2014-09-25 ENCOUNTER — Ambulatory Visit
Admission: RE | Admit: 2014-09-25 | Discharge: 2014-09-25 | Disposition: A | Discharge status: Home / Self Care | Payer: Medicare Other | Source: Ambulatory Visit | Attending: Internal Medicine | Admitting: Internal Medicine

## 2014-09-25 DIAGNOSIS — I8290 Acute embolism and thrombosis of unspecified vein: Secondary | ICD-10-CM

## 2014-09-25 DIAGNOSIS — R791 Abnormal coagulation profile: Secondary | ICD-10-CM | POA: Diagnosis not present

## 2014-09-25 DIAGNOSIS — M7989 Other specified soft tissue disorders: Secondary | ICD-10-CM | POA: Diagnosis not present

## 2014-11-02 DIAGNOSIS — M67912 Unspecified disorder of synovium and tendon, left shoulder: Secondary | ICD-10-CM | POA: Diagnosis not present

## 2014-12-05 DIAGNOSIS — Z85828 Personal history of other malignant neoplasm of skin: Secondary | ICD-10-CM | POA: Diagnosis not present

## 2014-12-05 DIAGNOSIS — I781 Nevus, non-neoplastic: Secondary | ICD-10-CM | POA: Diagnosis not present

## 2014-12-05 DIAGNOSIS — L821 Other seborrheic keratosis: Secondary | ICD-10-CM | POA: Diagnosis not present

## 2014-12-05 DIAGNOSIS — D229 Melanocytic nevi, unspecified: Secondary | ICD-10-CM | POA: Diagnosis not present

## 2014-12-05 DIAGNOSIS — L570 Actinic keratosis: Secondary | ICD-10-CM | POA: Diagnosis not present

## 2014-12-14 DIAGNOSIS — G4733 Obstructive sleep apnea (adult) (pediatric): Secondary | ICD-10-CM | POA: Diagnosis not present

## 2014-12-14 DIAGNOSIS — E780 Pure hypercholesterolemia: Secondary | ICD-10-CM | POA: Diagnosis not present

## 2014-12-14 DIAGNOSIS — I10 Essential (primary) hypertension: Secondary | ICD-10-CM | POA: Diagnosis not present

## 2014-12-14 DIAGNOSIS — F325 Major depressive disorder, single episode, in full remission: Secondary | ICD-10-CM | POA: Diagnosis not present

## 2015-01-23 DIAGNOSIS — M25562 Pain in left knee: Secondary | ICD-10-CM | POA: Diagnosis not present

## 2015-01-23 DIAGNOSIS — M545 Low back pain: Secondary | ICD-10-CM | POA: Diagnosis not present

## 2015-02-15 DIAGNOSIS — H47022 Hemorrhage in optic nerve sheath, left eye: Secondary | ICD-10-CM | POA: Diagnosis not present

## 2015-02-16 DIAGNOSIS — H40013 Open angle with borderline findings, low risk, bilateral: Secondary | ICD-10-CM | POA: Diagnosis not present

## 2015-02-20 DIAGNOSIS — M545 Low back pain: Secondary | ICD-10-CM | POA: Diagnosis not present

## 2015-02-20 DIAGNOSIS — M25562 Pain in left knee: Secondary | ICD-10-CM | POA: Diagnosis not present

## 2015-03-13 DIAGNOSIS — M5441 Lumbago with sciatica, right side: Secondary | ICD-10-CM | POA: Diagnosis not present

## 2015-03-20 DIAGNOSIS — H40003 Preglaucoma, unspecified, bilateral: Secondary | ICD-10-CM | POA: Diagnosis not present

## 2015-03-20 DIAGNOSIS — H2513 Age-related nuclear cataract, bilateral: Secondary | ICD-10-CM | POA: Diagnosis not present

## 2015-03-21 IMAGING — CR DG CHEST 2V
2 series · 2 of 2 positions shown · non-contrast
Comparison: None.

CLINICAL DATA: Hypertension, coronary artery disease

EXAM:
CHEST  2 VIEW

[w chest pa]
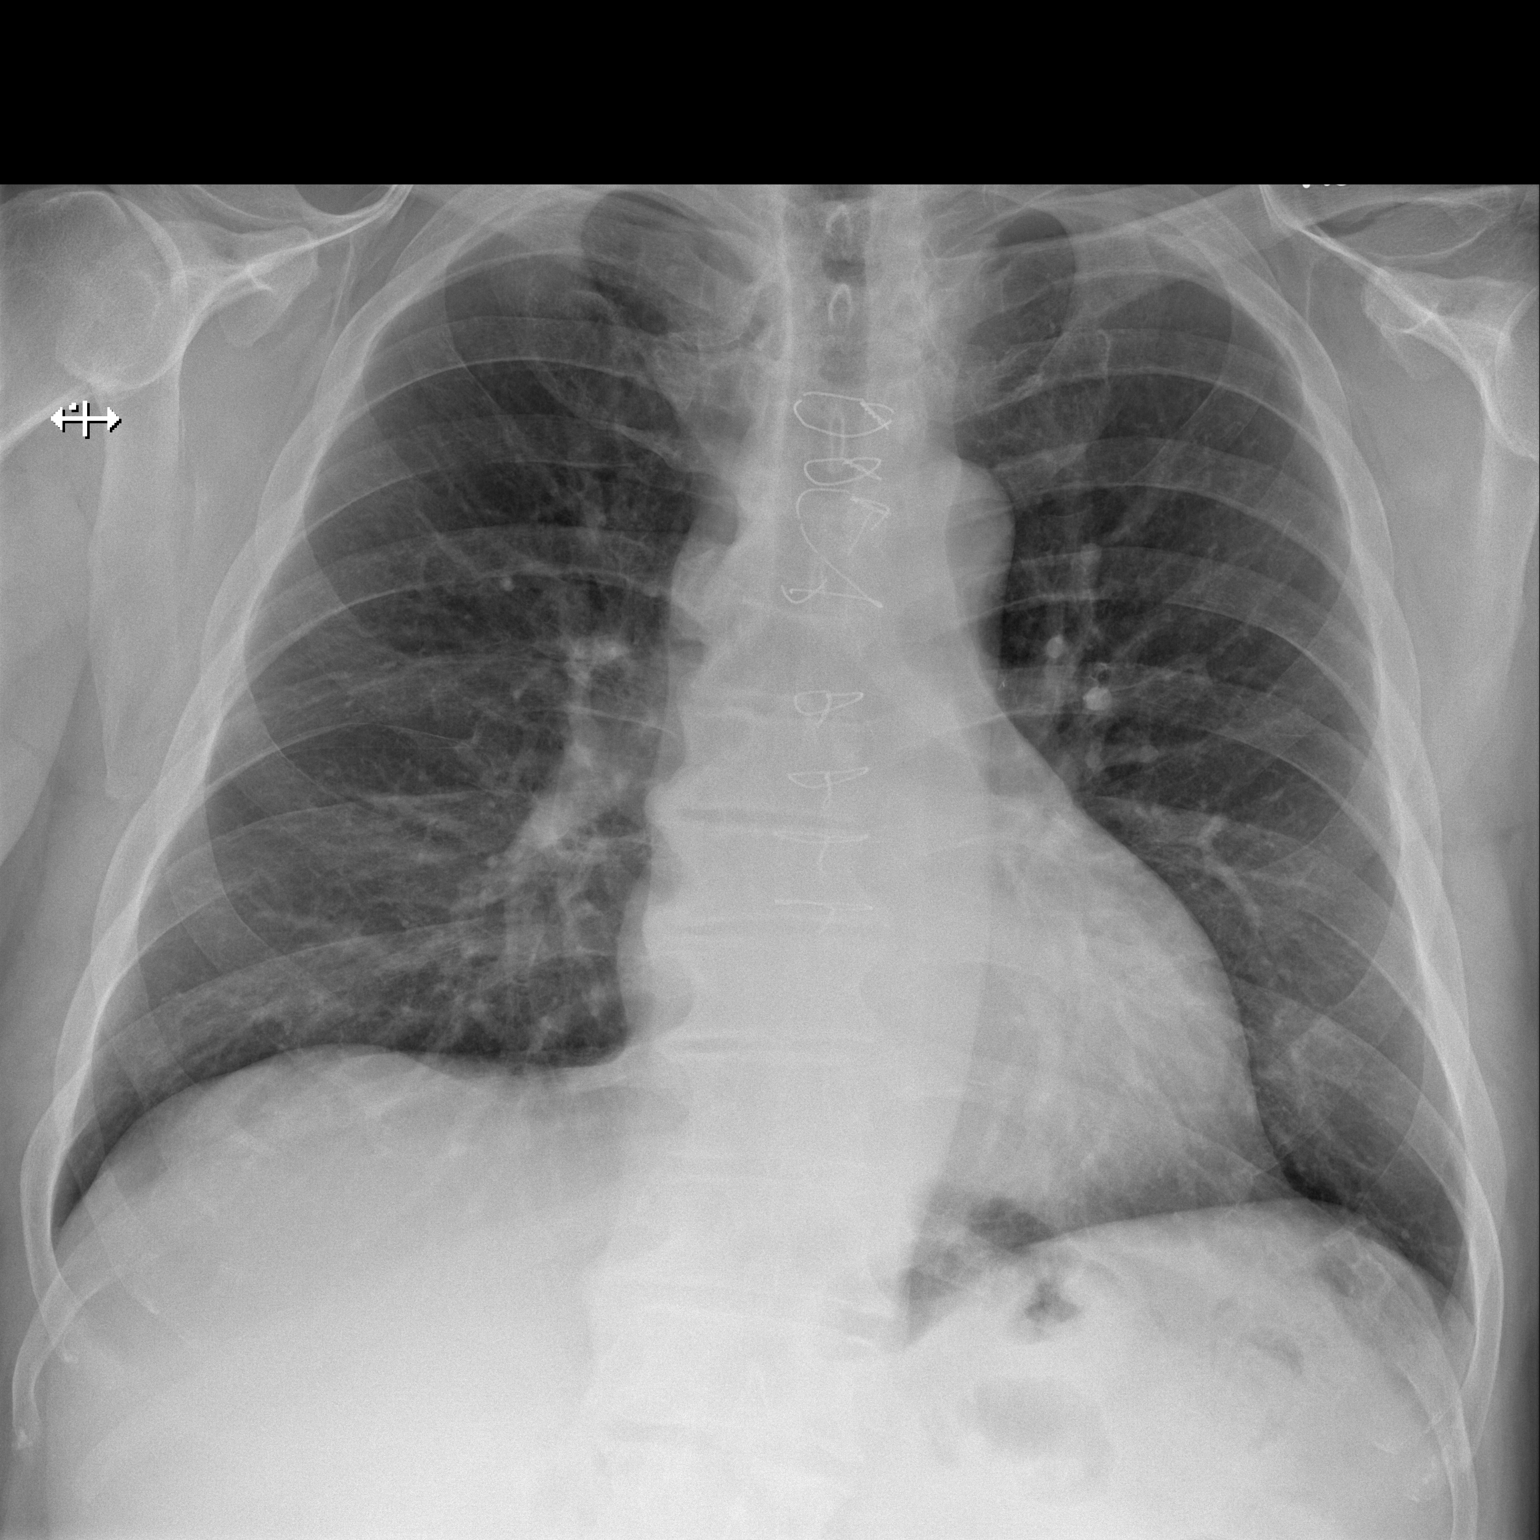

[w chest lat]
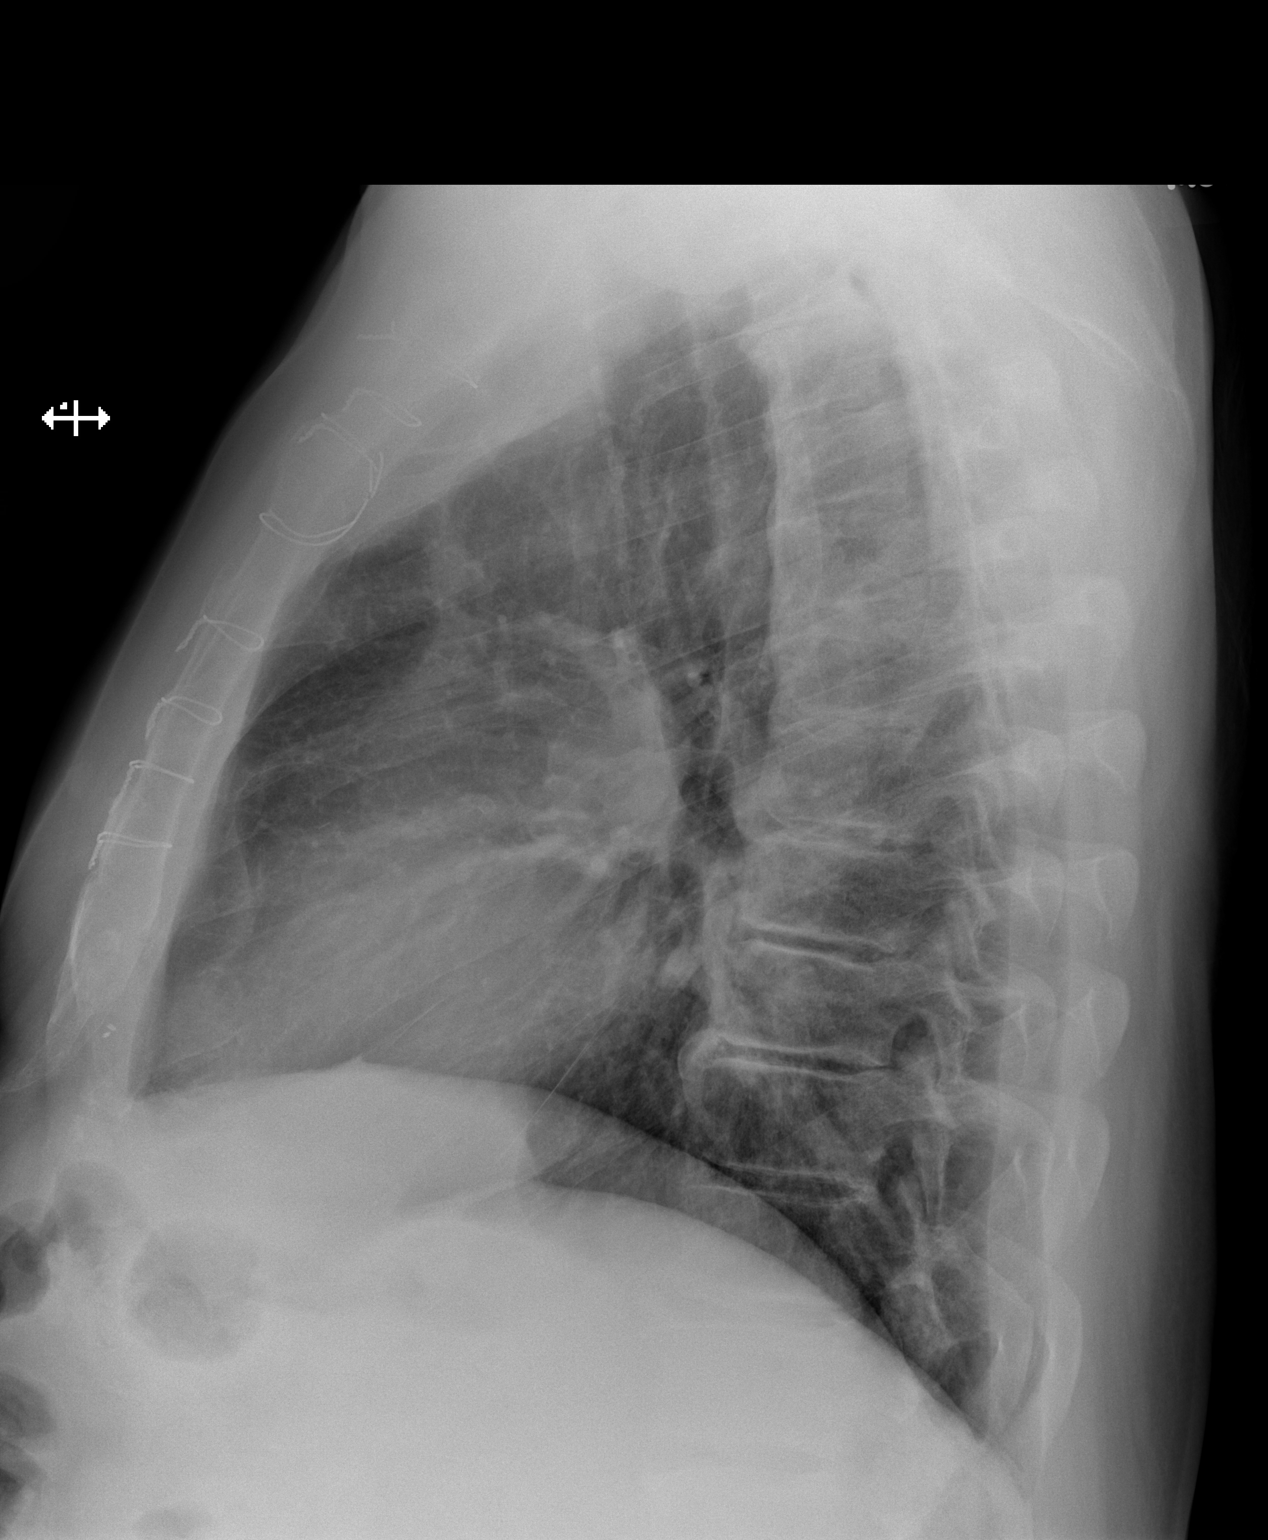

[2 of 2 positions shown; findings below may reference images not displayed]

FINDINGS: Postsurgical changes are seen. The cardiac shadow is within normal
limits. The lungs are clear bilaterally. Degenerative changes of the
thoracic spine are noted.
IMPRESSION: No acute abnormality seen.

## 2015-04-17 ENCOUNTER — Other Ambulatory Visit: Payer: Self-pay | Admitting: Interventional Cardiology

## 2015-04-17 DIAGNOSIS — I6523 Occlusion and stenosis of bilateral carotid arteries: Secondary | ICD-10-CM

## 2015-05-01 ENCOUNTER — Ambulatory Visit (HOSPITAL_COMMUNITY)
Admission: RE | Admit: 2015-05-01 | Discharge: 2015-05-01 | Disposition: A | Discharge status: Home / Self Care | Payer: Medicare Other | Source: Ambulatory Visit | Attending: Cardiovascular Disease | Admitting: Cardiovascular Disease

## 2015-05-01 DIAGNOSIS — I6523 Occlusion and stenosis of bilateral carotid arteries: Secondary | ICD-10-CM | POA: Diagnosis not present

## 2015-05-01 DIAGNOSIS — I10 Essential (primary) hypertension: Secondary | ICD-10-CM | POA: Insufficient documentation

## 2015-05-17 ENCOUNTER — Encounter: Payer: Self-pay | Admitting: Interventional Cardiology

## 2015-05-17 ENCOUNTER — Ambulatory Visit (INDEPENDENT_AMBULATORY_CARE_PROVIDER_SITE_OTHER): Payer: Medicare Other | Admitting: Interventional Cardiology

## 2015-05-17 ENCOUNTER — Other Ambulatory Visit: Payer: Self-pay

## 2015-05-17 VITALS — BP 132/80 | HR 60 | Ht 74.0 in | Wt 268.1 lb

## 2015-05-17 DIAGNOSIS — I6523 Occlusion and stenosis of bilateral carotid arteries: Secondary | ICD-10-CM

## 2015-05-17 DIAGNOSIS — R05 Cough: Secondary | ICD-10-CM | POA: Diagnosis not present

## 2015-05-17 DIAGNOSIS — I2510 Atherosclerotic heart disease of native coronary artery without angina pectoris: Secondary | ICD-10-CM

## 2015-05-17 DIAGNOSIS — E785 Hyperlipidemia, unspecified: Secondary | ICD-10-CM | POA: Diagnosis not present

## 2015-05-17 DIAGNOSIS — T464X5A Adverse effect of angiotensin-converting-enzyme inhibitors, initial encounter: Secondary | ICD-10-CM

## 2015-05-17 DIAGNOSIS — R058 Other specified cough: Secondary | ICD-10-CM

## 2015-05-17 DIAGNOSIS — G4733 Obstructive sleep apnea (adult) (pediatric): Secondary | ICD-10-CM

## 2015-05-17 DIAGNOSIS — I10 Essential (primary) hypertension: Secondary | ICD-10-CM

## 2015-05-17 MED ORDER — ASPIRIN EC 81 MG PO TBEC: 81.0000 mg | DELAYED_RELEASE_TABLET | Freq: Every day | ORAL | Status: DC

## 2015-05-17 MED ORDER — VALSARTAN 160 MG PO TABS: 160.0000 mg | ORAL_TABLET | Freq: Every day | ORAL | Status: DC

## 2015-05-17 NOTE — Progress Notes (Signed)
Cardiology Office Note   Date:  05/17/2015   ID:  Scott Welch, DOB 1946/01/13, MRN [id number]  PCP:  Irven Shelling, MD  Cardiologist:  Sinclair Grooms, MD   Chief Complaint  Patient presents with  . Coronary Artery Disease      History of Present Illness: Scott Welch is a 70 y.o. male who presents for CAD with multivessel stenting, hypertension, hyperlipidemia, erectile dysfunction, and sleep apnea.  From a cardiovascular standpoint,  feels he is doing well. He has not had chest discomfort, palpitations, syncope, or excessive dyspnea. He does note left lower extremity swelling every since he had left knee replacement. He recently had right knee replacement and is doing quite well.    Past Medical History  Diagnosis Date  . Hypertension   . Depression   . Knee pain     followed by Dr. Berenice Primas at [facility]  . Coronary artery disease     one-vessel followed by Dr. Tamala Julian  . OSA on CPAP     since 1995. BIPAP  . Obesity   . Erectile dysfunction   . Osteoarthritis     left knee, Dr. Berenice Primas  . Allergic rhinitis   . Plantar fasciitis   . Cervical radiculopathy     2008  . Acne   . DJD (degenerative joint disease)     [place]  . Ear congestion     ear tubes, [place]  . Fractures     left elbow, right elbow, nose, multiple fingers, left clavicle    Past Surgical History  Procedure Laterality Date  . Coronary artery bypass graft      LIMA TO LAD  . Cardiac catheterization      drug-eluting stents in circumflev coronary artery, 2006  . Hernia repair    . Knee arthroscopy Bilateral   . Total knee arthroplasty Left 03/18/2013    Procedure: LEFT TOTAL KNEE ARTHROPLASTY;  Surgeon: Alta Corning, MD;  Location: [place];  Service: Orthopedics;  Laterality: Left;  . Elbow surgery Bilateral     Compound fracture  . Tonsillectomy    . Nasal sinus surgery    . Tympanostomy tube placement    . Total knee arthroplasty Right 04/21/2014    Procedure: TOTAL  KNEE ARTHROPLASTY;  Surgeon: Alta Corning, MD;  Location: [place];  Service: Orthopedics;  Laterality: Right;     Current Outpatient Prescriptions  Medication Sig Dispense Refill  . aspirin EC 325 MG tablet Take 1 tablet (325 mg total) by mouth 2 (two) times daily after a meal. 60 tablet 0  . atorvastatin (LIPITOR) 20 MG tablet Take 20 mg by mouth daily.    Marland Kitchen escitalopram (LEXAPRO) 10 MG tablet Take 10 mg by mouth daily.    . fluticasone (FLONASE) 50 MCG/ACT nasal spray Place 2 sprays into the nose daily as needed for allergies.     Marland Kitchen lisinopril (PRINIVIL,ZESTRIL) 20 MG tablet Take 20 mg by mouth daily.    . Menthol, Topical Analgesic, (ICY HOT EX) Apply 1 application topically daily as needed (applies for back pain).    . methocarbamol (ROBAXIN) 500 MG tablet Take 1 tablet (500 mg total) by mouth every 8 (eight) hours as needed for muscle spasms. 50 tablet 0  . Misc Natural Products (OSTEO BI-FLEX TRIPLE STRENGTH PO) Take 1 tablet by mouth daily.     . Multiple Vitamin (MULTIVITAMIN) tablet Take 1 tablet by mouth daily.    . Omega-3 Fatty Acids (FISH OIL  PO) Take 1 capsule by mouth daily.    Marland Kitchen oxyCODONE-acetaminophen (PERCOCET/ROXICET) 5-325 MG per tablet Take 1-2 tablets by mouth every 4 (four) hours as needed for severe pain. 60 tablet 0  . sildenafil (VIAGRA) 50 MG tablet Take 50 mg by mouth daily as needed for erectile dysfunction.     No current facility-administered medications for this visit.    Allergies:   Review of patient's allergies indicates no known allergies.    Social History:  The patient  reports that he has never smoked. He has never used smokeless tobacco. He reports that he drinks about 1.2 oz of alcohol per week. He reports that he does not use illicit drugs.   Family History:  The patient's family history includes CAD in his father and mother; Dementia in his mother; Diabetes in his sister; Gastric cancer in his sister; Glaucoma in his brother; Hypertension in his  sister.    ROS:  Please see the history of present illness.   Otherwise, review of systems are positive for intermittent sharp shooting pain above his left eye from time to time. Dry hacking cough for months to perhaps a couple years. Back pain, lower extremity (left) swelling..   All other systems are reviewed and negative.    PHYSICAL EXAM: VS:  BP 132/80 mmHg  Pulse 60  Ht 6\' 2"  (1.88 m)  Wt 268 lb 1.9 oz (121.618 kg)  BMI 34.41 kg/m2 , BMI Body mass index is 34.41 kg/(m^2). GEN: Well nourished, well developed, in no acute distress HEENT: normal Neck: no JVD, carotid bruits, or masses Cardiac: RRR.  There is no murmur, rub, or gallop. There is no edema. Respiratory:  clear to auscultation bilaterally, normal work of breathing. GI: soft, nontender, nondistended, + BS MS: no deformity or atrophy Skin: warm and dry, no rash Neuro:  Strength and sensation are intact Psych: euthymic mood, full affect   EKG:  EKG is ordered today. The ekg reveals normal sinus rhythm, prominent voltage, first AV block, otherwise normal.   Recent Labs: No results found for requested labs within last 365 days.    Lipid Panel No results found for: CHOL, TRIG, HDL, CHOLHDL, VLDL, LDLCALC, LDLDIRECT    Wt Readings from Last 3 Encounters:  05/17/15 268 lb 1.9 oz (121.618 kg)  04/21/14 264 lb 6.4 oz (119.931 kg)  04/18/14 264 lb 6.4 oz (119.931 kg)      Other studies Reviewed: Additional studies/ records that were reviewed today include: Electronic health record entry since the last office visit.. The findings include successful right knee replacement surgery is noted..    ASSESSMENT AND PLAN:  1. Coronary artery disease involving native coronary artery of native heart without angina pectoris Asymptomatic  2. Carotid artery stenosis, bilateral Asymptomatic and will need carotid Doppler follow-up in one year  3. Hyperlipidemia Stable on current medical regimen  4. Cough possibly related  to ACE inhibitor therapy Discontinue lisinopril  5. Essential hypertension, benign Under good control on current medical regimen. We will switch lisinopril to an ARB    Current medicines are reviewed at length with the patient today.  The patient has the following concerns regarding medicines: None in particular..  The following changes/actions have been instituted:    Because of probable ACE inhibitor induced cough, lisinopril be discontinued and valsartan 160 mg daily will be started  Monitor blood pressure 2-3 times per week for 2 weeks to make sure no significant increase in blood pressure after medication change  Facial pain  possibly related to tic [place]  Aspirin 81 mg per day instead of [id number]  Basic metabolic panel in 2-4 weeks  Labs/ tests ordered today include:  No orders of the defined types were placed in this encounter.     Disposition:   FU with HS in 1 year  Signed, Sinclair Grooms, MD  05/17/2015 9:09 AM    [address], [place], [place]  [address] Phone: [phone]; Fax: [phone]

## 2015-05-17 NOTE — Patient Instructions (Signed)
Medication Instructions:  Your physician has recommended you make the following change in your medication:  1) STOP Lisinopril 2) START Valsartan 160mg  daily. An Rx has been sent to your pharmacy   Labwork: Your physician recommends that you return for lab work in: 4 weeks   Testing/Procedures: None ordered  Follow-Up: Your physician wants you to follow-up in: 1 year with Dr.Smith You will receive a reminder letter in the mail two months in advance. If you don't receive a letter, please call our office to schedule the follow-up appointment.   Any Other Special Instructions Will Be Listed Below (If Applicable). Your physician has requested that you regularly monitor and record your blood pressure readings at home. Please use the same machine at the same time of day to check your readings and record them. Check your blood pressure 2-3 times a wek for 2 weeks and call the office with your readings.  Call the office in 4 weeks to give an update on your cough      If you need a refill on your cardiac medications before your next appointment, please call your pharmacy.

## 2015-06-21 ENCOUNTER — Other Ambulatory Visit: Payer: Medicare Other | Admitting: *Deleted

## 2015-06-21 DIAGNOSIS — I10 Essential (primary) hypertension: Secondary | ICD-10-CM | POA: Diagnosis not present

## 2015-06-21 LAB — BASIC METABOLIC PANEL
BUN: 27 mg/dL — ABNORMAL HIGH (ref 7–25)
CALCIUM: 8.9 mg/dL (ref 8.6–10.3)
CO2: 26 mmol/L (ref 20–31)
Chloride: 107 mmol/L (ref 98–110)
Creat: 0.93 mg/dL (ref 0.70–1.18)
Glucose, Bld: 105 mg/dL — ABNORMAL HIGH (ref 65–99)
Potassium: 4.3 mmol/L (ref 3.5–5.3)
SODIUM: 142 mmol/L (ref 135–146)

## 2015-07-16 ENCOUNTER — Other Ambulatory Visit: Payer: Self-pay

## 2015-07-16 DIAGNOSIS — I10 Essential (primary) hypertension: Secondary | ICD-10-CM

## 2015-07-16 MED ORDER — VALSARTAN 160 MG PO TABS: 160.0000 mg | ORAL_TABLET | Freq: Every day | ORAL | Status: DC

## 2015-07-25 DIAGNOSIS — L821 Other seborrheic keratosis: Secondary | ICD-10-CM | POA: Diagnosis not present

## 2015-07-25 DIAGNOSIS — Z85828 Personal history of other malignant neoplasm of skin: Secondary | ICD-10-CM | POA: Diagnosis not present

## 2015-07-25 DIAGNOSIS — I781 Nevus, non-neoplastic: Secondary | ICD-10-CM | POA: Diagnosis not present

## 2015-07-25 DIAGNOSIS — D229 Melanocytic nevi, unspecified: Secondary | ICD-10-CM | POA: Diagnosis not present

## 2015-07-25 DIAGNOSIS — L570 Actinic keratosis: Secondary | ICD-10-CM | POA: Diagnosis not present

## 2015-07-26 DIAGNOSIS — R197 Diarrhea, unspecified: Secondary | ICD-10-CM | POA: Diagnosis not present

## 2015-07-26 DIAGNOSIS — R739 Hyperglycemia, unspecified: Secondary | ICD-10-CM | POA: Diagnosis not present

## 2015-07-26 DIAGNOSIS — R1030 Lower abdominal pain, unspecified: Secondary | ICD-10-CM | POA: Diagnosis not present

## 2015-07-30 DIAGNOSIS — R1030 Lower abdominal pain, unspecified: Secondary | ICD-10-CM | POA: Diagnosis not present

## 2015-12-14 DIAGNOSIS — Z96653 Presence of artificial knee joint, bilateral: Secondary | ICD-10-CM | POA: Diagnosis not present

## 2015-12-14 DIAGNOSIS — E669 Obesity, unspecified: Secondary | ICD-10-CM | POA: Diagnosis not present

## 2015-12-14 DIAGNOSIS — R600 Localized edema: Secondary | ICD-10-CM | POA: Diagnosis not present

## 2015-12-14 DIAGNOSIS — I10 Essential (primary) hypertension: Secondary | ICD-10-CM | POA: Diagnosis not present

## 2015-12-14 DIAGNOSIS — F329 Major depressive disorder, single episode, unspecified: Secondary | ICD-10-CM | POA: Diagnosis not present

## 2015-12-14 DIAGNOSIS — I2510 Atherosclerotic heart disease of native coronary artery without angina pectoris: Secondary | ICD-10-CM | POA: Diagnosis not present

## 2015-12-25 DIAGNOSIS — R600 Localized edema: Secondary | ICD-10-CM | POA: Diagnosis not present

## 2016-01-01 DIAGNOSIS — K8020 Calculus of gallbladder without cholecystitis without obstruction: Secondary | ICD-10-CM | POA: Diagnosis not present

## 2016-01-01 DIAGNOSIS — R600 Localized edema: Secondary | ICD-10-CM | POA: Diagnosis not present

## 2016-01-04 DIAGNOSIS — J302 Other seasonal allergic rhinitis: Secondary | ICD-10-CM | POA: Diagnosis not present

## 2016-01-04 DIAGNOSIS — Z9622 Myringotomy tube(s) status: Secondary | ICD-10-CM | POA: Diagnosis not present

## 2016-01-04 DIAGNOSIS — H6122 Impacted cerumen, left ear: Secondary | ICD-10-CM | POA: Diagnosis not present

## 2016-01-04 DIAGNOSIS — G4733 Obstructive sleep apnea (adult) (pediatric): Secondary | ICD-10-CM | POA: Diagnosis not present

## 2016-01-04 DIAGNOSIS — J0141 Acute recurrent pansinusitis: Secondary | ICD-10-CM | POA: Diagnosis not present

## 2016-01-04 DIAGNOSIS — H9212 Otorrhea, left ear: Secondary | ICD-10-CM | POA: Diagnosis not present

## 2016-01-04 DIAGNOSIS — Z9989 Dependence on other enabling machines and devices: Secondary | ICD-10-CM | POA: Diagnosis not present

## 2016-01-08 DIAGNOSIS — G8929 Other chronic pain: Secondary | ICD-10-CM | POA: Diagnosis not present

## 2016-01-08 DIAGNOSIS — I2510 Atherosclerotic heart disease of native coronary artery without angina pectoris: Secondary | ICD-10-CM | POA: Diagnosis not present

## 2016-01-08 DIAGNOSIS — E669 Obesity, unspecified: Secondary | ICD-10-CM | POA: Diagnosis not present

## 2016-01-08 DIAGNOSIS — M549 Dorsalgia, unspecified: Secondary | ICD-10-CM | POA: Diagnosis not present

## 2016-01-08 DIAGNOSIS — E785 Hyperlipidemia, unspecified: Secondary | ICD-10-CM | POA: Diagnosis not present

## 2016-01-08 DIAGNOSIS — R600 Localized edema: Secondary | ICD-10-CM | POA: Diagnosis not present

## 2016-01-14 DIAGNOSIS — I2510 Atherosclerotic heart disease of native coronary artery without angina pectoris: Secondary | ICD-10-CM | POA: Diagnosis not present

## 2016-01-14 DIAGNOSIS — M7989 Other specified soft tissue disorders: Secondary | ICD-10-CM | POA: Diagnosis not present

## 2016-02-01 DIAGNOSIS — H6983 Other specified disorders of Eustachian tube, bilateral: Secondary | ICD-10-CM | POA: Diagnosis not present

## 2016-02-01 DIAGNOSIS — J0141 Acute recurrent pansinusitis: Secondary | ICD-10-CM | POA: Diagnosis not present

## 2016-02-01 DIAGNOSIS — G4733 Obstructive sleep apnea (adult) (pediatric): Secondary | ICD-10-CM | POA: Diagnosis not present

## 2016-02-01 DIAGNOSIS — J302 Other seasonal allergic rhinitis: Secondary | ICD-10-CM | POA: Diagnosis not present

## 2016-02-04 DIAGNOSIS — J329 Chronic sinusitis, unspecified: Secondary | ICD-10-CM | POA: Diagnosis not present

## 2016-02-04 DIAGNOSIS — J0141 Acute recurrent pansinusitis: Secondary | ICD-10-CM | POA: Diagnosis not present

## 2016-02-19 ENCOUNTER — Other Ambulatory Visit: Payer: Self-pay | Admitting: Otolaryngology

## 2016-02-19 DIAGNOSIS — Z23 Encounter for immunization: Secondary | ICD-10-CM | POA: Diagnosis not present

## 2016-02-19 DIAGNOSIS — F329 Major depressive disorder, single episode, unspecified: Secondary | ICD-10-CM | POA: Diagnosis not present

## 2016-02-19 DIAGNOSIS — G8929 Other chronic pain: Secondary | ICD-10-CM | POA: Diagnosis not present

## 2016-02-19 DIAGNOSIS — Z125 Encounter for screening for malignant neoplasm of prostate: Secondary | ICD-10-CM | POA: Diagnosis not present

## 2016-02-19 DIAGNOSIS — I2510 Atherosclerotic heart disease of native coronary artery without angina pectoris: Secondary | ICD-10-CM | POA: Diagnosis not present

## 2016-02-19 DIAGNOSIS — M549 Dorsalgia, unspecified: Secondary | ICD-10-CM | POA: Diagnosis not present

## 2016-02-19 DIAGNOSIS — E669 Obesity, unspecified: Secondary | ICD-10-CM | POA: Diagnosis not present

## 2016-02-19 DIAGNOSIS — Z0000 Encounter for general adult medical examination without abnormal findings: Secondary | ICD-10-CM | POA: Diagnosis not present

## 2016-02-19 DIAGNOSIS — E785 Hyperlipidemia, unspecified: Secondary | ICD-10-CM | POA: Diagnosis not present

## 2016-02-19 DIAGNOSIS — I10 Essential (primary) hypertension: Secondary | ICD-10-CM | POA: Diagnosis not present

## 2016-02-26 DIAGNOSIS — G4733 Obstructive sleep apnea (adult) (pediatric): Secondary | ICD-10-CM | POA: Diagnosis not present

## 2016-02-26 DIAGNOSIS — J324 Chronic pansinusitis: Secondary | ICD-10-CM | POA: Diagnosis not present

## 2016-02-28 DIAGNOSIS — D729 Disorder of white blood cells, unspecified: Secondary | ICD-10-CM | POA: Diagnosis not present

## 2016-03-11 ENCOUNTER — Encounter (HOSPITAL_COMMUNITY)
Admission: RE | Admit: 2016-03-11 | Discharge: 2016-03-11 | Disposition: A | Discharge status: Home / Self Care | Payer: Medicare Other | Source: Ambulatory Visit | Attending: Otolaryngology | Admitting: Otolaryngology

## 2016-03-11 ENCOUNTER — Encounter (HOSPITAL_COMMUNITY): Payer: Self-pay

## 2016-03-11 ENCOUNTER — Other Ambulatory Visit (HOSPITAL_COMMUNITY): Payer: Self-pay | Admitting: *Deleted

## 2016-03-11 DIAGNOSIS — N529 Male erectile dysfunction, unspecified: Secondary | ICD-10-CM | POA: Diagnosis not present

## 2016-03-11 DIAGNOSIS — J328 Other chronic sinusitis: Secondary | ICD-10-CM | POA: Diagnosis not present

## 2016-03-11 DIAGNOSIS — G4733 Obstructive sleep apnea (adult) (pediatric): Secondary | ICD-10-CM | POA: Diagnosis not present

## 2016-03-11 DIAGNOSIS — E669 Obesity, unspecified: Secondary | ICD-10-CM | POA: Diagnosis not present

## 2016-03-11 DIAGNOSIS — Z6832 Body mass index (BMI) 32.0-32.9, adult: Secondary | ICD-10-CM | POA: Diagnosis not present

## 2016-03-11 DIAGNOSIS — Z96653 Presence of artificial knee joint, bilateral: Secondary | ICD-10-CM | POA: Diagnosis not present

## 2016-03-11 DIAGNOSIS — I2510 Atherosclerotic heart disease of native coronary artery without angina pectoris: Secondary | ICD-10-CM | POA: Diagnosis not present

## 2016-03-11 DIAGNOSIS — Z8249 Family history of ischemic heart disease and other diseases of the circulatory system: Secondary | ICD-10-CM | POA: Diagnosis not present

## 2016-03-11 DIAGNOSIS — F329 Major depressive disorder, single episode, unspecified: Secondary | ICD-10-CM | POA: Diagnosis not present

## 2016-03-11 DIAGNOSIS — Z7982 Long term (current) use of aspirin: Secondary | ICD-10-CM | POA: Diagnosis not present

## 2016-03-11 DIAGNOSIS — Z951 Presence of aortocoronary bypass graft: Secondary | ICD-10-CM | POA: Diagnosis not present

## 2016-03-11 DIAGNOSIS — J330 Polyp of nasal cavity: Secondary | ICD-10-CM | POA: Diagnosis not present

## 2016-03-11 DIAGNOSIS — I10 Essential (primary) hypertension: Secondary | ICD-10-CM | POA: Diagnosis not present

## 2016-03-11 DIAGNOSIS — M1990 Unspecified osteoarthritis, unspecified site: Secondary | ICD-10-CM | POA: Diagnosis not present

## 2016-03-11 HISTORY — DX: Headache: R51

## 2016-03-11 HISTORY — DX: Headache, unspecified: R51.9

## 2016-03-11 LAB — CBC
HEMATOCRIT: 42.5 % (ref 39.0–52.0)
Hemoglobin: 14.4 g/dL (ref 13.0–17.0)
MCH: 30.8 pg (ref 26.0–34.0)
MCHC: 33.9 g/dL (ref 30.0–36.0)
MCV: 91 fL (ref 78.0–100.0)
Platelets: 177 10*3/uL (ref 150–400)
RBC: 4.67 MIL/uL (ref 4.22–5.81)
RDW: 12.9 % (ref 11.5–15.5)
WBC: 4.5 10*3/uL (ref 4.0–10.5)

## 2016-03-11 LAB — BASIC METABOLIC PANEL
Anion gap: 5 (ref 5–15)
BUN: 19 mg/dL (ref 6–20)
CHLORIDE: 109 mmol/L (ref 101–111)
CO2: 26 mmol/L (ref 22–32)
Calcium: 9.2 mg/dL (ref 8.9–10.3)
Creatinine, Ser: 1.08 mg/dL (ref 0.61–1.24)
GFR calc Af Amer: >60 mL/min (ref >60)
GFR calc non Af Amer: >60 mL/min (ref >60)
GLUCOSE: 121 mg/dL — AB (ref 65–99)
POTASSIUM: 4 mmol/L (ref 3.5–5.1)
Sodium: 140 mmol/L (ref 135–145)

## 2016-03-11 NOTE — Progress Notes (Signed)
Anesthesia Chart Review:  Pt is a 70 year old male scheduled for B endoscopic sinus surgery with fusion on 03/12/2016 with Jerrell Belfast, MD.   - Cardiologist is Daneen Schick, MD, last office visit 05/17/15; 1 year f/u recommended.   PMH includes:  CAD (s/p CABG 2000 (LIMA-LAD); s/p DES to CX 2006), HTN, OSA. Never smoker. BMI 32.5. S/p R TKA 04/21/14. S/p L TKA 03/18/13.  Medications include: ASA, lipitor, sildenafil, valsartan  Preoperative labs reviewed.    EKG 05/17/15: sinus rhythm with 1st degree AV block.   Carotid duplex 05/01/15:  - Stable R ICA stenosis 40-59% - Stable L ICA stenosis 1-39%  Last stress test 2008 (in correspondence 04/24/14 in media tab)  If no changes, I anticipate pt can proceed with surgery as scheduled.   Willeen Cass, FNP-BC [facility]/Anesthesiology Phone: [phone] 03/11/2016 1:57 PM

## 2016-03-11 NOTE — Pre-Procedure Instructions (Addendum)
Scott Welch  03/11/2016     Your procedure is scheduled on Wednesday, March 12, 2016    Report to [facility] Entrance "A" Admitting Office at 9:00 AM.   Call this number if you have problems the morning of surgery: [phone]   Remember:  Do not eat food or drink liquids after midnight tonight.  STOP all herbel meds, nsaids (aleve,naproxen,advil,ibuprofen)  NOW including aspirin, all vitamins,flaxseed, osteo biflex    Do not wear jewelry.  Do not wear lotions, powders, or cologne.  Men may shave face and neck.  Do not bring valuables to the hospital.  [facility] is not responsible for any belongings or valuables.  Contacts, dentures or bridgework may not be worn into surgery.  Leave your suitcase in the car.  After surgery it may be brought to your room.  For patients admitted to the hospital, discharge time will be determined by your treatment team.  Patients discharged the day of surgery will not be allowed to drive home.   Special instructions:  Scott Welch - Preparing for Surgery  Before surgery, you can play an important role.  Because skin is not sterile, your skin needs to be as free of germs as possible.  You can reduce the number of germs on you skin by washing with CHG (chlorahexidine gluconate) soap before surgery.  CHG is an antiseptic cleaner which kills germs and bonds with the skin to continue killing germs even after washing.  Please DO NOT use if you have an allergy to CHG or antibacterial soaps.  If your skin becomes reddened/irritated stop using the CHG and inform your nurse when you arrive at Short Stay.  Do not shave (including legs and underarms) for at least 48 hours prior to the first CHG shower.  You may shave your face.  Please follow these instructions carefully:   1.  Shower with CHG Soap the night before surgery and the                    morning of Surgery.  2.  If you choose to wash your hair, wash your hair first as usual  with your       normal shampoo.  3.  After you shampoo, rinse your hair and body thoroughly to remove the shampoo.  4.  Use CHG as you would any other liquid soap.  You can apply chg directly       to the skin and wash gently with scrungie or a clean washcloth.  5.  Apply the CHG Soap to your body ONLY FROM THE NECK DOWN.        Do not use on open wounds or open sores.  Avoid contact with your eyes, ears, mouth and genitals (private parts).  Wash genitals (private parts) with your normal soap.  6.  Wash thoroughly, paying special attention to the area where your surgery        will be performed.  7.  Thoroughly rinse your body with warm water from the neck down.  8.  DO NOT shower/wash with your normal soap after using and rinsing off       the CHG Soap.  9.  Pat yourself dry with a clean towel.            10.  Wear clean pajamas.            11.  Place clean sheets on your bed the night of your first  shower and do not        sleep with pets.  Day of Surgery  Do not apply any lotions the morning of surgery.  Please wear clean clothes to the hospital.   Please read over the fact sheets that you were given.

## 2016-03-11 NOTE — Progress Notes (Signed)
Dr shoemaker office called for attestation order. Spoke with [employer].

## 2016-03-12 ENCOUNTER — Encounter (HOSPITAL_COMMUNITY)
Admission: RE | Disposition: A | Discharge status: Home / Self Care | Payer: Self-pay | Source: Ambulatory Visit | Attending: Otolaryngology

## 2016-03-12 ENCOUNTER — Ambulatory Visit (HOSPITAL_COMMUNITY): Payer: Medicare Other | Admitting: Anesthesiology

## 2016-03-12 ENCOUNTER — Ambulatory Visit (HOSPITAL_COMMUNITY)
Admission: RE | Admit: 2016-03-12 | Discharge: 2016-03-12 | Disposition: A | Discharge status: Home / Self Care | Payer: Medicare Other | Source: Ambulatory Visit | Attending: Otolaryngology | Admitting: Otolaryngology

## 2016-03-12 ENCOUNTER — Encounter (HOSPITAL_COMMUNITY): Payer: Self-pay | Admitting: Surgery

## 2016-03-12 ENCOUNTER — Ambulatory Visit (HOSPITAL_COMMUNITY): Payer: Medicare Other | Admitting: Emergency Medicine

## 2016-03-12 DIAGNOSIS — N529 Male erectile dysfunction, unspecified: Secondary | ICD-10-CM | POA: Insufficient documentation

## 2016-03-12 DIAGNOSIS — I2510 Atherosclerotic heart disease of native coronary artery without angina pectoris: Secondary | ICD-10-CM | POA: Insufficient documentation

## 2016-03-12 DIAGNOSIS — I10 Essential (primary) hypertension: Secondary | ICD-10-CM | POA: Insufficient documentation

## 2016-03-12 DIAGNOSIS — J330 Polyp of nasal cavity: Secondary | ICD-10-CM | POA: Diagnosis not present

## 2016-03-12 DIAGNOSIS — E669 Obesity, unspecified: Secondary | ICD-10-CM | POA: Insufficient documentation

## 2016-03-12 DIAGNOSIS — Z6832 Body mass index (BMI) 32.0-32.9, adult: Secondary | ICD-10-CM | POA: Insufficient documentation

## 2016-03-12 DIAGNOSIS — Z7982 Long term (current) use of aspirin: Secondary | ICD-10-CM | POA: Insufficient documentation

## 2016-03-12 DIAGNOSIS — J321 Chronic frontal sinusitis: Secondary | ICD-10-CM | POA: Diagnosis not present

## 2016-03-12 DIAGNOSIS — J328 Other chronic sinusitis: Secondary | ICD-10-CM | POA: Diagnosis not present

## 2016-03-12 DIAGNOSIS — J320 Chronic maxillary sinusitis: Secondary | ICD-10-CM | POA: Diagnosis not present

## 2016-03-12 DIAGNOSIS — J339 Nasal polyp, unspecified: Secondary | ICD-10-CM | POA: Diagnosis not present

## 2016-03-12 DIAGNOSIS — Z8249 Family history of ischemic heart disease and other diseases of the circulatory system: Secondary | ICD-10-CM | POA: Insufficient documentation

## 2016-03-12 DIAGNOSIS — J322 Chronic ethmoidal sinusitis: Secondary | ICD-10-CM | POA: Diagnosis not present

## 2016-03-12 DIAGNOSIS — F329 Major depressive disorder, single episode, unspecified: Secondary | ICD-10-CM | POA: Insufficient documentation

## 2016-03-12 DIAGNOSIS — G4733 Obstructive sleep apnea (adult) (pediatric): Secondary | ICD-10-CM | POA: Diagnosis not present

## 2016-03-12 DIAGNOSIS — M1990 Unspecified osteoarthritis, unspecified site: Secondary | ICD-10-CM | POA: Diagnosis not present

## 2016-03-12 DIAGNOSIS — J329 Chronic sinusitis, unspecified: Secondary | ICD-10-CM | POA: Diagnosis not present

## 2016-03-12 DIAGNOSIS — Z96653 Presence of artificial knee joint, bilateral: Secondary | ICD-10-CM | POA: Insufficient documentation

## 2016-03-12 DIAGNOSIS — Z951 Presence of aortocoronary bypass graft: Secondary | ICD-10-CM | POA: Insufficient documentation

## 2016-03-12 DIAGNOSIS — Z8709 Personal history of other diseases of the respiratory system: Secondary | ICD-10-CM | POA: Diagnosis not present

## 2016-03-12 HISTORY — PX: NASAL SINUS SURGERY: SHX719

## 2016-03-12 HISTORY — PX: SINUS ENDO WITH FUSION: SHX5329

## 2016-03-12 SURGERY — SINUS SURGERY, ENDOSCOPIC
Anesthesia: General | Site: Nose | Laterality: Bilateral

## 2016-03-12 MED ORDER — HYDROMORPHONE HCL 1 MG/ML IJ SOLN: 0.2500 mg | INTRAMUSCULAR | Status: DC | PRN

## 2016-03-12 MED ORDER — CHLORHEXIDINE GLUCONATE CLOTH 2 % EX PADS: 6.0000 | MEDICATED_PAD | Freq: Once | CUTANEOUS | Status: DC

## 2016-03-12 MED ORDER — OXYMETAZOLINE HCL 0.05 % NA SOLN
NASAL | Status: DC | PRN
  Administered 2016-03-12: 1

## 2016-03-12 MED ORDER — MUPIROCIN CALCIUM 2 % EX CREA
TOPICAL_CREAM | CUTANEOUS | Status: AC
Start: 2016-03-12 — End: 2016-03-12
  Filled 2016-03-12: qty 15

## 2016-03-12 MED ORDER — HYDROCODONE-ACETAMINOPHEN 5-325 MG PO TABS: 1.0000 | ORAL_TABLET | Freq: Four times a day (QID) | ORAL | 0 refills | Status: DC | PRN

## 2016-03-12 MED ORDER — MUPIROCIN 2 % EX OINT
TOPICAL_OINTMENT | CUTANEOUS | Status: DC | PRN
  Administered 2016-03-12: 1 via TOPICAL

## 2016-03-12 MED ORDER — OXYMETAZOLINE HCL 0.05 % NA SOLN
1.0000 | Freq: Two times a day (BID) | NASAL | Status: DC
  Administered 2016-03-12 (×2): 1 via NASAL

## 2016-03-12 MED ORDER — LACTATED RINGERS IV SOLN
INTRAVENOUS | Status: DC
  Administered 2016-03-12 (×2): via INTRAVENOUS

## 2016-03-12 MED ORDER — PROPOFOL 10 MG/ML IV BOLUS
INTRAVENOUS | Status: DC | PRN
  Administered 2016-03-12: 200 mg via INTRAVENOUS

## 2016-03-12 MED ORDER — ARTIFICIAL TEARS OP OINT
TOPICAL_OINTMENT | OPHTHALMIC | Status: DC | PRN
  Administered 2016-03-12: 1 via OPHTHALMIC

## 2016-03-12 MED ORDER — ROCURONIUM BROMIDE 100 MG/10ML IV SOLN
INTRAVENOUS | Status: DC | PRN
  Administered 2016-03-12: 20 mg via INTRAVENOUS
  Administered 2016-03-12: 50 mg via INTRAVENOUS

## 2016-03-12 MED ORDER — OXYMETAZOLINE HCL 0.05 % NA SOLN
NASAL | Status: AC
  Filled 2016-03-12: qty 15

## 2016-03-12 MED ORDER — TRIAMCINOLONE ACETONIDE 40 MG/ML IJ SUSP
INTRAMUSCULAR | Status: DC | PRN
  Administered 2016-03-12: 200 mg

## 2016-03-12 MED ORDER — LIDOCAINE-EPINEPHRINE 2 %-1:100000 IJ SOLN
INTRAMUSCULAR | Status: DC | PRN
  Administered 2016-03-12: 4 mL

## 2016-03-12 MED ORDER — CEFAZOLIN SODIUM-DEXTROSE 2-4 GM/100ML-% IV SOLN
INTRAVENOUS | Status: AC
  Filled 2016-03-12: qty 100

## 2016-03-12 MED ORDER — SODIUM CHLORIDE 0.9 % IR SOLN
Status: DC | PRN
  Administered 2016-03-12: 1000 mL

## 2016-03-12 MED ORDER — CEFAZOLIN SODIUM-DEXTROSE 2-4 GM/100ML-% IV SOLN: 2.0000 g | INTRAVENOUS | Status: DC

## 2016-03-12 MED ORDER — ARTIFICIAL TEARS OP OINT
TOPICAL_OINTMENT | OPHTHALMIC | Status: AC
  Filled 2016-03-12: qty 3.5

## 2016-03-12 MED ORDER — MIDAZOLAM HCL 2 MG/2ML IJ SOLN
INTRAMUSCULAR | Status: AC
  Filled 2016-03-12: qty 2

## 2016-03-12 MED ORDER — ONDANSETRON HCL 4 MG/2ML IJ SOLN
INTRAMUSCULAR | Status: DC | PRN
  Administered 2016-03-12: 4 mg via INTRAVENOUS

## 2016-03-12 MED ORDER — MIDAZOLAM HCL 2 MG/2ML IJ SOLN
INTRAMUSCULAR | Status: DC | PRN
  Administered 2016-03-12: 1 mg via INTRAVENOUS

## 2016-03-12 MED ORDER — MUPIROCIN CALCIUM 2 % EX CREA
TOPICAL_CREAM | CUTANEOUS | Status: DC | PRN
  Administered 2016-03-12: 1 via TOPICAL

## 2016-03-12 MED ORDER — OXYMETAZOLINE HCL 0.05 % NA SOLN
NASAL | Status: AC
  Administered 2016-03-12: 1 via NASAL
  Filled 2016-03-12: qty 15

## 2016-03-12 MED ORDER — LIDOCAINE-EPINEPHRINE 2 %-1:100000 IJ SOLN
INTRAMUSCULAR | Status: AC
  Filled 2016-03-12: qty 1

## 2016-03-12 MED ORDER — AMOXICILLIN-POT CLAVULANATE 500-125 MG PO TABS: 1.0000 | ORAL_TABLET | Freq: Two times a day (BID) | ORAL | 0 refills | Status: DC

## 2016-03-12 MED ORDER — LIDOCAINE 2% (20 MG/ML) 5 ML SYRINGE
INTRAMUSCULAR | Status: DC | PRN
  Administered 2016-03-12: 100 mg via INTRAVENOUS

## 2016-03-12 MED ORDER — DEXAMETHASONE SODIUM PHOSPHATE 10 MG/ML IJ SOLN
INTRAMUSCULAR | Status: AC
  Filled 2016-03-12: qty 1

## 2016-03-12 MED ORDER — PROMETHAZINE HCL 25 MG/ML IJ SOLN: 6.2500 mg | INTRAMUSCULAR | Status: DC | PRN

## 2016-03-12 MED ORDER — MUPIROCIN 2 % EX OINT
TOPICAL_OINTMENT | CUTANEOUS | Status: AC
  Filled 2016-03-12: qty 22

## 2016-03-12 MED ORDER — DEXAMETHASONE SODIUM PHOSPHATE 10 MG/ML IJ SOLN
10.0000 mg | Freq: Once | INTRAMUSCULAR | Status: AC
  Administered 2016-03-12: 10 mg via INTRAVENOUS

## 2016-03-12 MED ORDER — TRIAMCINOLONE ACETONIDE 40 MG/ML IJ SUSP
INTRAMUSCULAR | Status: AC
  Filled 2016-03-12: qty 5

## 2016-03-12 MED ORDER — SUGAMMADEX SODIUM 200 MG/2ML IV SOLN
INTRAVENOUS | Status: DC | PRN
  Administered 2016-03-12: 200 mg via INTRAVENOUS

## 2016-03-12 MED ORDER — ONDANSETRON HCL 4 MG/2ML IJ SOLN
INTRAMUSCULAR | Status: AC
  Filled 2016-03-12: qty 2

## 2016-03-12 MED ORDER — PROPOFOL 10 MG/ML IV BOLUS
INTRAVENOUS | Status: AC
  Filled 2016-03-12: qty 20

## 2016-03-12 MED ORDER — FENTANYL CITRATE (PF) 100 MCG/2ML IJ SOLN
INTRAMUSCULAR | Status: AC
  Filled 2016-03-12: qty 4

## 2016-03-12 MED ORDER — FENTANYL CITRATE (PF) 100 MCG/2ML IJ SOLN
INTRAMUSCULAR | Status: DC | PRN
  Administered 2016-03-12: 50 ug via INTRAVENOUS
  Administered 2016-03-12: 100 ug via INTRAVENOUS

## 2016-03-12 MED ORDER — PHENYLEPHRINE HCL 10 MG/ML IJ SOLN
INTRAVENOUS | Status: DC | PRN
  Administered 2016-03-12: 25 ug/min via INTRAVENOUS

## 2016-03-12 MED ORDER — 0.9 % SODIUM CHLORIDE (POUR BTL) OPTIME
TOPICAL | Status: DC | PRN
  Administered 2016-03-12: 1000 mL

## 2016-03-12 MED ORDER — LIDOCAINE 2% (20 MG/ML) 5 ML SYRINGE
INTRAMUSCULAR | Status: AC
  Filled 2016-03-12: qty 5

## 2016-03-12 MED ORDER — SUGAMMADEX SODIUM 200 MG/2ML IV SOLN
INTRAVENOUS | Status: AC
  Filled 2016-03-12: qty 2

## 2016-03-12 SURGICAL SUPPLY — 57 items
ATTRACTOMAT 16X20 MAGNETIC DRP (DRAPES) IMPLANT
BLADE RAD40 ROTATE 4M 4 5PK (BLADE) IMPLANT
BLADE RAD40 ROTATE 4M 4MM 5PK (BLADE)
BLADE RAD60 ROTATE M4 4 5PK (BLADE) IMPLANT
BLADE RAD60 ROTATE M4 4MM 5PK (BLADE)
BLADE ROTATE RAD 40 4 M4 (BLADE) ×2 IMPLANT
BLADE ROTATE RAD 40 4MM M4 (BLADE) ×1
BLADE ROTATE TRICUT 4MX13CM M4 (BLADE) ×1
BLADE ROTATE TRICUT 4X13 M4 (BLADE) ×2 IMPLANT
BLADE SURG 15 STRL LF DISP TIS (BLADE) IMPLANT
BLADE SURG 15 STRL SS (BLADE)
BLADE TRICUT ROTATE M4 4 5PK (BLADE) ×2 IMPLANT
BLADE TRICUT ROTATE M4 4MM 5PK (BLADE) ×1
CANISTER SUCTION 2500CC (MISCELLANEOUS) ×6 IMPLANT
COAGULATOR SUCT 6 FR SWTCH (ELECTROSURGICAL)
COAGULATOR SUCT 8FR VV (MISCELLANEOUS) IMPLANT
COAGULATOR SUCT SWTCH 10FR 6 (ELECTROSURGICAL) IMPLANT
DECANTER SPIKE VIAL GLASS SM (MISCELLANEOUS) ×3 IMPLANT
DRAPE PROXIMA HALF (DRAPES) ×3 IMPLANT
DRESSING NASAL KENNEDY 3.5X.9 (MISCELLANEOUS) IMPLANT
DRSG NASAL KENNEDY 3.5X.9 (MISCELLANEOUS)
ELECT COATED BLADE 2.86 ST (ELECTRODE) IMPLANT
ELECT REM PT RETURN 9FT ADLT (ELECTROSURGICAL) ×3
ELECTRODE REM PT RTRN 9FT ADLT (ELECTROSURGICAL) ×1 IMPLANT
FILTER ARTHROSCOPY CONVERTOR (FILTER) ×3 IMPLANT
FLUID NSS /IRRIG 1000 ML XXX (MISCELLANEOUS) ×3 IMPLANT
GLOVE BIOGEL M 7.0 STRL (GLOVE) ×6 IMPLANT
GOWN STRL REUS W/ TWL LRG LVL3 (GOWN DISPOSABLE) ×2 IMPLANT
GOWN STRL REUS W/TWL LRG LVL3 (GOWN DISPOSABLE) ×4
KIT BASIN OR (CUSTOM PROCEDURE TRAY) ×3 IMPLANT
KIT ROOM TURNOVER OR (KITS) ×3 IMPLANT
NEEDLE 18GX1X1/2 (RX/OR ONLY) (NEEDLE) ×3 IMPLANT
NEEDLE HYPO 25GX1X1/2 BEV (NEEDLE) ×3 IMPLANT
NS IRRIG 1000ML POUR BTL (IV SOLUTION) ×3 IMPLANT
PAD ARMBOARD 7.5X6 YLW CONV (MISCELLANEOUS) ×6 IMPLANT
PAD ENT ADHESIVE 25PK (MISCELLANEOUS) ×3 IMPLANT
PENCIL BUTTON HOLSTER BLD 10FT (ELECTRODE) IMPLANT
SPECIMEN JAR SMALL (MISCELLANEOUS) ×3 IMPLANT
SPONGE NEURO XRAY DETECT 1X3 (DISPOSABLE) ×3 IMPLANT
SUT CHROMIC 5 0 P 3 (SUTURE) IMPLANT
SUT ETHILON 3 0 FSL (SUTURE) IMPLANT
SUT PLAIN 4 0 ~~LOC~~ 1 (SUTURE) IMPLANT
SWAB COLLECTION DEVICE MRSA (MISCELLANEOUS) IMPLANT
SYR BULB 3OZ (MISCELLANEOUS) IMPLANT
SYR CONTROL 10ML LL (SYRINGE) ×3 IMPLANT
TOWEL OR 17X24 6PK STRL BLUE (TOWEL DISPOSABLE) ×3 IMPLANT
TRACKER ENT INSTRUMENT (MISCELLANEOUS) ×3 IMPLANT
TRACKER ENT PATIENT (MISCELLANEOUS) ×3 IMPLANT
TRAP SPECIMEN MUCOUS 40CC (MISCELLANEOUS) IMPLANT
TRAY ENT MC OR (CUSTOM PROCEDURE TRAY) ×3 IMPLANT
TUBE ANAEROBIC SPECIMEN COL (MISCELLANEOUS) IMPLANT
TUBE CONNECTING 12'X1/4 (SUCTIONS) ×1
TUBE CONNECTING 12X1/4 (SUCTIONS) ×2 IMPLANT
TUBING EXTENTION W/L.L. (IV SETS) ×3 IMPLANT
TUBING STRAIGHTSHOT EPS 5PK (TUBING) ×3 IMPLANT
WATER STERILE IRR 1000ML POUR (IV SOLUTION) ×3 IMPLANT
WIPE INSTRUMENT VISIWIPE 73X73 (MISCELLANEOUS) ×3 IMPLANT

## 2016-03-12 NOTE — Transfer of Care (Signed)
Immediate Anesthesia Transfer of Care Note  Patient: Scott Welch  Procedure(s) Performed: Procedure(s): ENDOSCOPIC SINUS SURGERY (Bilateral) SINUS ENDO WITH FUSION (Bilateral)  Patient Location: PACU  Anesthesia Type:General  Level of Consciousness: awake, alert  and oriented  Airway & Oxygen Therapy: Patient Spontanous Breathing and Patient connected to face mask oxygen  Post-op Assessment: Report given to RN, Post -op Vital signs reviewed and stable and Patient moving all extremities  Post vital signs: Reviewed and stable  Last Vitals:  Vitals:   03/12/16 0932 03/12/16 1239  BP: (!) 158/83   Pulse: (!) 56 75  Resp: 20 (!) 9  Temp: 36.7 C 36.5 C    Last Pain:  Vitals:   03/12/16 0932  TempSrc: Oral      Patients Stated Pain Goal: 6 ([id number])  Complications: No apparent anesthesia complications

## 2016-03-12 NOTE — H&P (Signed)
Scott Welch is an 71 y.o. male.   Chief Complaint: Chronic sinusitis HPI: Chronic sinusitis with congestion and d/c  Past Medical History:  Diagnosis Date  . Acne   . Allergic rhinitis   . Cervical radiculopathy    2008  . Coronary artery disease    one-vessel followed by Dr. Tamala Julian  . Depression   . DJD (degenerative joint disease)    [place]  . Ear congestion    ear tubes, Scott Welch  . Erectile dysfunction   . Fractures    left elbow, right elbow, nose, multiple fingers, left clavicle  . Headache    sinus  . Hypertension   . Knee pain    followed by Dr. Berenice Primas at [facility]  . Obesity   . OSA on CPAP    since 1995. BIPAP  . Osteoarthritis    left knee, Dr. Berenice Primas  . Plantar fasciitis     Past Surgical History:  Procedure Laterality Date  . CARDIAC CATHETERIZATION     drug-eluting stents in circumflev coronary artery, 2006  . CORONARY ARTERY BYPASS GRAFT  2000   LIMA TO LAD  . ELBOW SURGERY Bilateral    Compound fracture as a kid  . HERNIA REPAIR    . KNEE ARTHROSCOPY Bilateral   . NASAL SINUS SURGERY    . TONSILLECTOMY    . TOTAL KNEE ARTHROPLASTY Left 03/18/2013   Procedure: LEFT TOTAL KNEE ARTHROPLASTY;  Surgeon: Alta Corning, MD;  Location: [place];  Service: Orthopedics;  Laterality: Left;  . TOTAL KNEE ARTHROPLASTY Right 04/21/2014   Procedure: TOTAL KNEE ARTHROPLASTY;  Surgeon: Alta Corning, MD;  Location: [place];  Service: Orthopedics;  Laterality: Right;  . TYMPANOSTOMY TUBE PLACEMENT      Family History  Problem Relation Age of Onset  . CAD Mother   . Dementia Mother   . CAD Father   . Hypertension Sister   . Diabetes Sister   . Gastric cancer Sister   . Glaucoma Brother    Social History:  reports that he has never smoked. He has never used smokeless tobacco. He reports that he drinks about 8.4 oz of alcohol per week . He reports that he does not use drugs.  Allergies: No Known Allergies  Medications Prior to Admission  Medication Sig  Dispense Refill  . aspirin EC 81 MG tablet Take 1 tablet (81 mg total) by mouth daily. (Patient taking differently: Take 81 mg by mouth every evening. )    . citalopram (CELEXA) 10 MG tablet Take 10 mg by mouth every evening.  0  . fexofenadine (ALLEGRA) 180 MG tablet Take 180 mg by mouth every evening.    . Flaxseed, Linseed, (FLAXSEED OIL PO) Take 1,400 mg by mouth every evening.    . fluticasone (FLONASE) 50 MCG/ACT nasal spray Place 1-2 sprays into the nose daily as needed for allergies.     . Menthol, Topical Analgesic, (ICY HOT EX) Apply 1 application topically daily as needed (applies for back pain).    . Misc Natural Products (OSTEO BI-FLEX TRIPLE STRENGTH PO) Take 1 tablet by mouth every evening.     . Multiple Vitamin (MULTIVITAMIN) tablet Take 1 tablet by mouth daily. Centrum Silver    . valsartan (DIOVAN) 160 MG tablet Take 1 tablet (160 mg total) by mouth daily. 90 tablet 3  . atorvastatin (LIPITOR) 20 MG tablet Take 20 mg by mouth every evening.     . sildenafil (VIAGRA) 50 MG tablet Take 50  mg by mouth daily as needed for erectile dysfunction.      Results for orders placed or performed during the hospital encounter of 03/11/16 (from the past 48 hour(s))  Basic metabolic panel     Status: Abnormal   Collection Time: 03/11/16  9:13 AM  Result Value Ref Range   Sodium 140 135 - 145 mmol/L   Potassium 4.0 3.5 - 5.1 mmol/L   Chloride 109 101 - 111 mmol/L   CO2 26 22 - 32 mmol/L   Glucose, Bld 121 (H) 65 - 99 mg/dL   BUN 19 6 - 20 mg/dL   Creatinine, Ser 1.08 0.61 - 1.24 mg/dL   Calcium 9.2 8.9 - 10.3 mg/dL   GFR calc non Af Amer >60 >60 mL/min   GFR calc Af Amer >60 >60 mL/min    Comment: (NOTE) The eGFR has been calculated using the CKD EPI equation. This calculation has not been validated in all clinical situations. eGFR's persistently <60 mL/min signify possible Chronic Kidney Disease.    Anion gap 5 5 - 15  CBC     Status: None   Collection Time: 03/11/16  9:13 AM   Result Value Ref Range   WBC 4.5 4.0 - 10.5 K/uL   RBC 4.67 4.22 - 5.81 MIL/uL   Hemoglobin 14.4 13.0 - 17.0 g/dL   HCT 42.5 39.0 - 52.0 %   MCV 91.0 78.0 - 100.0 fL   MCH 30.8 26.0 - 34.0 pg   MCHC 33.9 30.0 - 36.0 g/dL   RDW 12.9 11.5 - 15.5 %   Platelets 177 150 - 400 K/uL   No results found.  Review of Systems  Constitutional: Negative.   HENT: Positive for congestion.   Respiratory: Positive for cough.   Cardiovascular: Negative.     Blood pressure (!) 158/83, pulse (!) 56, temperature 98 F (36.7 C), temperature source Oral, resp. rate 20, height '6\' 2"'$  (1.88 m), weight 115.2 kg (254 lb), SpO2 98 %. Physical Exam  Constitutional: He is oriented to person, place, and time. He appears well-developed and well-nourished.  HENT:  Chronic congestion and d/c  Neck: Normal range of motion. Neck supple.  Cardiovascular: Normal rate.   Respiratory: Effort normal.  GI: Soft.  Neurological: He is alert and oriented to person, place, and time.     Assessment/Plan Adm for OP ESS under GA  Genae Strine, MD 03/12/2016, 11:01 AM

## 2016-03-12 NOTE — Anesthesia Preprocedure Evaluation (Addendum)
Anesthesia Evaluation  Patient identified by MRN, date of birth, ID band Patient awake    Reviewed: Allergy & Precautions, NPO status , Patient's Chart, lab work & pertinent test results  History of Anesthesia Complications Negative for: history of anesthetic complications  Airway Mallampati: II  TM Distance: >3 FB Neck ROM: Full    Dental  (+) Teeth Intact, Dental Advisory Given   Pulmonary    breath sounds clear to auscultation       Cardiovascular hypertension, + CAD, + Cardiac Stents and + Peripheral Vascular Disease   Rhythm:Regular Rate:Normal     Neuro/Psych    GI/Hepatic   Endo/Other    Renal/GU      Musculoskeletal  (+) Arthritis ,   Abdominal   Peds  Hematology   Anesthesia Other Findings   Reproductive/Obstetrics                            Anesthesia Physical Anesthesia Plan  ASA: III  Anesthesia Plan: General   Post-op Pain Management:    Induction: Intravenous  Airway Management Planned: Oral ETT  Additional Equipment:   Intra-op Plan:   Post-operative Plan: Extubation in OR  Informed Consent: I have reviewed the patients History and Physical, chart, labs and discussed the procedure including the risks, benefits and alternatives for the proposed anesthesia with the patient or authorized representative who has indicated his/her understanding and acceptance.   Dental advisory given  Plan Discussed with:   Anesthesia Plan Comments:         Anesthesia Quick Evaluation

## 2016-03-12 NOTE — Brief Op Note (Signed)
03/12/2016  12:37 PM  PATIENT:  Scott Welch  70 y.o. male  PRE-OPERATIVE DIAGNOSIS:  ACUTE SINUSITIS  POST-OPERATIVE DIAGNOSIS:  ACUTE SINUSITIS  PROCEDURE:  Procedure(s): ENDOSCOPIC SINUS SURGERY (Bilateral) SINUS ENDO WITH FUSION (Bilateral)  SURGEON:  Surgeon(s) and Role:    * Jerrell Belfast, MD - Primary  PHYSICIAN ASSISTANT:   ASSISTANTS: none   ANESTHESIA:   general  EBL:  Total I/O In: -  Out: 100 [Blood:100]  BLOOD ADMINISTERED:none  DRAINS: none   LOCAL MEDICATIONS USED:  LIDOCAINE  and Amount: 4 ml  SPECIMEN:  Source of Specimen:  sinus contents  DISPOSITION OF SPECIMEN:  PATHOLOGY  COUNTS:  YES  TOURNIQUET:  * No tourniquets in log *  DICTATION: .Other Dictation: Dictation Number [id number]  PLAN OF CARE: Discharge to home after PACU  PATIENT DISPOSITION:  PACU - hemodynamically stable.   Delay start of Pharmacological VTE agent (>24hrs) due to surgical blood loss or risk of bleeding: not applicable

## 2016-03-12 NOTE — Anesthesia Procedure Notes (Signed)
Procedure Name: Intubation Date/Time: 03/12/2016 11:14 AM Performed by: Trixie Deis A Pre-anesthesia Checklist: Patient identified, Emergency Drugs available, Suction available and Patient being monitored Patient Re-evaluated:Patient Re-evaluated prior to inductionOxygen Delivery Method: Circle System Utilized Preoxygenation: Pre-oxygenation with 100% oxygen Intubation Type: IV induction Ventilation: Mask ventilation without difficulty and Oral airway inserted - appropriate to patient size Laryngoscope Size: Mac and 4 Grade View: Grade II Tube type: Oral Tube size: 7.5 mm Number of attempts: 1 Airway Equipment and Method: Stylet and Oral airway Placement Confirmation: ETT inserted through vocal cords under direct vision,  positive ETCO2 and breath sounds checked- equal and bilateral Secured at: 23 cm Tube secured with: Tape (to L side of mouth) Dental Injury: Teeth and Oropharynx as per pre-operative assessment

## 2016-03-13 ENCOUNTER — Encounter (HOSPITAL_COMMUNITY): Payer: Self-pay | Admitting: Otolaryngology

## 2016-03-13 NOTE — Op Note (Signed)
NAMECRYSTIAN, Scott Welch NO.:  [id number]  MEDICAL RECORD NO.:  [id number]  LOCATION:                                 FACILITY:  PHYSICIAN:  Early Chars. Wilburn Cornelia, M.D.DATE OF BIRTH:  October 04, 1945  DATE OF PROCEDURE:  03/12/2016 DATE OF DISCHARGE:                              OPERATIVE REPORT   PREOPERATIVE DIAGNOSES: 1. Chronic sinusitis with nasal polyposis. 2. Status post previous sinonasal surgery. 3. History of obstructive sleep apnea.  POSTOPERATIVE DIAGNOSES: 1. Chronic sinusitis with nasal polyposis. 2. Status post previous sinonasal surgery. 3. History of obstructive sleep apnea.  INDICATION FOR SURGERY: 1. Chronic sinusitis with nasal polyposis. 2. Status post previous sinonasal surgery. 3. History of obstructive sleep apnea.  SURGICAL PROCEDURE:  Bilateral revision endoscopic sinus surgery with intraoperative computer-assisted navigation (fusion) consisting of bilateral total ethmoidectomies, bilateral maxillary antrostomy with removal of diseased tissue, and bilateral nasal frontal recess exploration.  ANESTHESIA:  General endotracheal.  SURGEON:  Early Chars. Wilburn Cornelia, M.D.  COMPLICATIONS:  None.  ESTIMATED BLOOD LOSS:  100 mL.  CONDITION:  Patient transferred from the operating room to the recovery room in stable condition.  FINDINGS:  Significant bilateral polypoid disease, greater on the right. No evidence of purulent discharge, crusting, or active infection.  At the conclusion of the surgical procedure, 50:50 mix of Kenalog 40 and Bactroban slurry was instilled in the frontal ethmoid and maxillary sinuses.  No packing was placed.  HISTORY:  The patient is a 70 year old male, who was referred to our office with a history of chronic progressive nasal airway obstruction and chronic nasal discharge.  The patient has a history of obstructive sleep apnea and wears BiPAP.  He was having increasing difficulty using his apnea device because of  nasal congestion and chronic discharge.  He had been treated with multiple courses of antibiotics, topical nasal steroids, and saline irrigation with limited improvement.  The patient undergone previous sinonasal surgery over 10 years ago for chronic sinusitis and nasal polyposis.  A CT scan of the sinus was obtained preoperatively and this showed recurrent polypoid disease involving the superior ethmoid nasal frontal region and maxillary sinuses, greater on the right than left with significant air-fluid levels and mucus thickening.  Given the patient's history and findings and failure to respond to appropriate medical therapy, I recommended the above revision endoscopic sinus surgery.  The risks and benefits of the procedure were discussed in detail with the patient's wife and they understood and agreed with our plan for surgery which is scheduled on an elective basis at [facility] as an outpatient.  DESCRIPTION OF PROCEDURE:  The patient was brought to the operating room on March 12, 2016, and placed in supine position on the operating table.  General endotracheal anesthesia was established without difficulty.  When the patient was adequately anesthetized, he was positioned on the operating table.  His nose was then injected with a total of 4 mL of 2% lidocaine with 1:100,000 solution of epinephrine which was injected in submucosal fashion along the lateral nasal wall, middle turbinate, and intranasal polyps.  The patient's nose then packed with Afrin-soaked cottonoid pledgets which were left in place  for approximately 10 minutes for vasoconstriction hemostasis.  The Xomed fusion headgear was applied and anatomic and surgical landmarks were identified and confirmed, the navigation device was used throughout the sinus surgery.  When the patient prepped, draped, and prepared for surgery; a surgical time-out was performed for correct identification of the patient and  the surgical procedure.  Beginning on the right-hand side using a 0-degree endoscope, the patient's nasal cavity was examined.  There was a moderate amount of polypoid material within the nasal passageway which was resected with a straight microdebrider.  Dissection was then carried into the ethmoid region after carefully mobilizing the middle turbinate. Again, a moderate amount of polypoid disease was resected and dissection was carried from anterior to posterior.  The patient undergone previous surgery, but there was a moderate amount of scarring in the ethmoid region which was cleared and a total ethmoidectomy was performed by dissecting from anterior to posterior along the floor of the ethmoid sinus and then from posterior to anterior along the roof of the ethmoid sinus using a 45-degree telescope and a curved microdebrider.  The nasal frontal recess was identified.  This was occluded with underlying polypoid disease which was resected with a curved microdebrider.  The right nasal frontal recess was widely opened and the sinus itself was cleared of mucus secretions.  Attention was then turned to the lateral nasal wall with the previous surgical ostium was inspected.  Again, there was a moderate amount of polypoid material in the anterior border, and this was resected with a curved microdebrider and a 45-degree telescope.  The sinus itself was cleared off polypoid material.  No purulent material or active infection.  With the right side completed, left side was then inspected with a 0- degree endoscope.  There was significant polypoid material within the nasal passageway and anterior ethmoid region which was resected with a straight microdebrider under direct visualization.  A total ethmoidectomy was then carried out from anterior to posterior along the floor of the ethmoid sinus dissecting polypoid material away from the lamina with a moderate amount of scarring.  Posterior superior  ethmoid region identified and dissection carried anteriorly with a 45-degree telescope and a curved microdebrider.  Using navigation, the natural ostium of the frontal sinus was identified, underlying disease with polyps and scar tissue was resected with a curved microdebrider creating a widely patent left nasal frontal recess.  Attention was turned to the lateral nasal wall where again polypoid material was resected.  The previous surgical sinus ostium was inspected along the anterior border, there was polypoid material which was resected with a curved microdebrider and the sinus itself was cleared off residual polyps.  The patient's nasal cavity was irrigated and suctioned.  Surgical debris cleared.  Sponge count was correct.  The sinuses were then instilled with a 50:50 slurry of Kenalog 40 and Bactroban cream which was instilled in the frontal ethmoid and maxillary sinuses.  There was no packing placed.  Nasopharynx was irrigated and suctioned.  An orogastric tube was passed, stomach contents were aspirated.  The patient was then awaken from his anesthetic.  He was extubated and transferred from the operating room to recovery room in stable condition.  No complications.  Blood loss approximately 100 mL.    ______________________________ Early Chars. Wilburn Cornelia, M.D.   ______________________________ Early Chars. Wilburn Cornelia, M.D.    DLS/MEDQ  D:  [id number]  T:  03/13/2016  Job:  [id number]

## 2016-03-13 NOTE — Anesthesia Postprocedure Evaluation (Signed)
Anesthesia Post Note  Patient: Scott Welch  Procedure(s) Performed: Procedure(s) (LRB): ENDOSCOPIC SINUS SURGERY (Bilateral) SINUS ENDO WITH FUSION (Bilateral)  Patient location during evaluation: PACU Anesthesia Type: General Level of consciousness: awake and alert Pain management: pain level controlled Vital Signs Assessment: post-procedure vital signs reviewed and stable Respiratory status: spontaneous breathing, nonlabored ventilation, respiratory function stable and patient connected to nasal cannula oxygen Cardiovascular status: blood pressure returned to baseline and stable Postop Assessment: no signs of nausea or vomiting Anesthetic complications: no    Last Vitals:  Vitals:   03/12/16 1313 03/12/16 1324  BP: (!) 159/83 (!) 163/73  Pulse: 67 67  Resp: 15 14  Temp: 36.5 C     Last Pain:  Vitals:   03/12/16 1324  TempSrc:   PainSc: 1                  Brigido Mera,JAMES TERRILL

## 2016-04-22 IMAGING — CR DG CHEST 2V
2 series · 2 of 2 positions shown · non-contrast
Comparison: 03/08/2013

CLINICAL DATA: Preop testing.

EXAM:
CHEST  2 VIEW

[w chest pa]
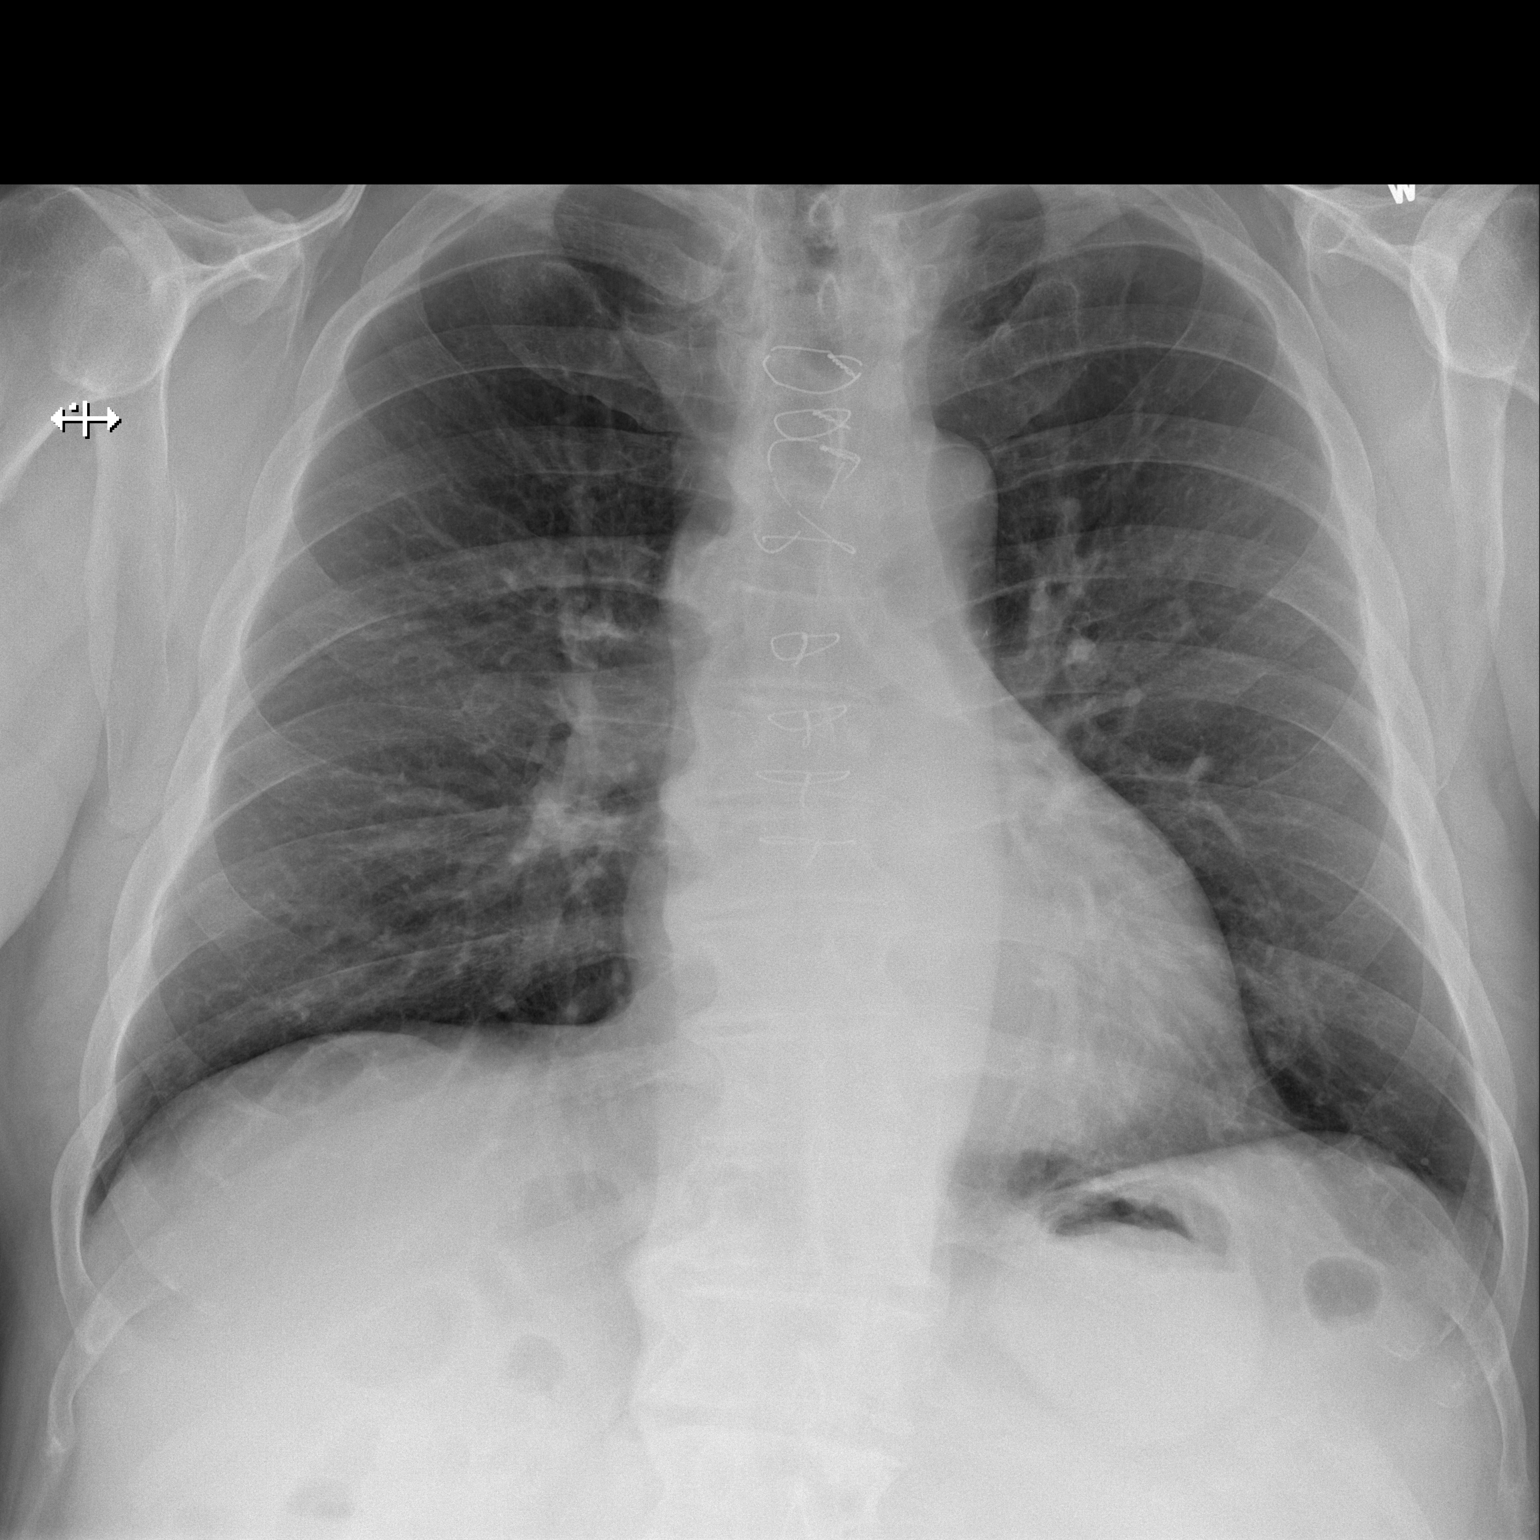

[w chest lat]
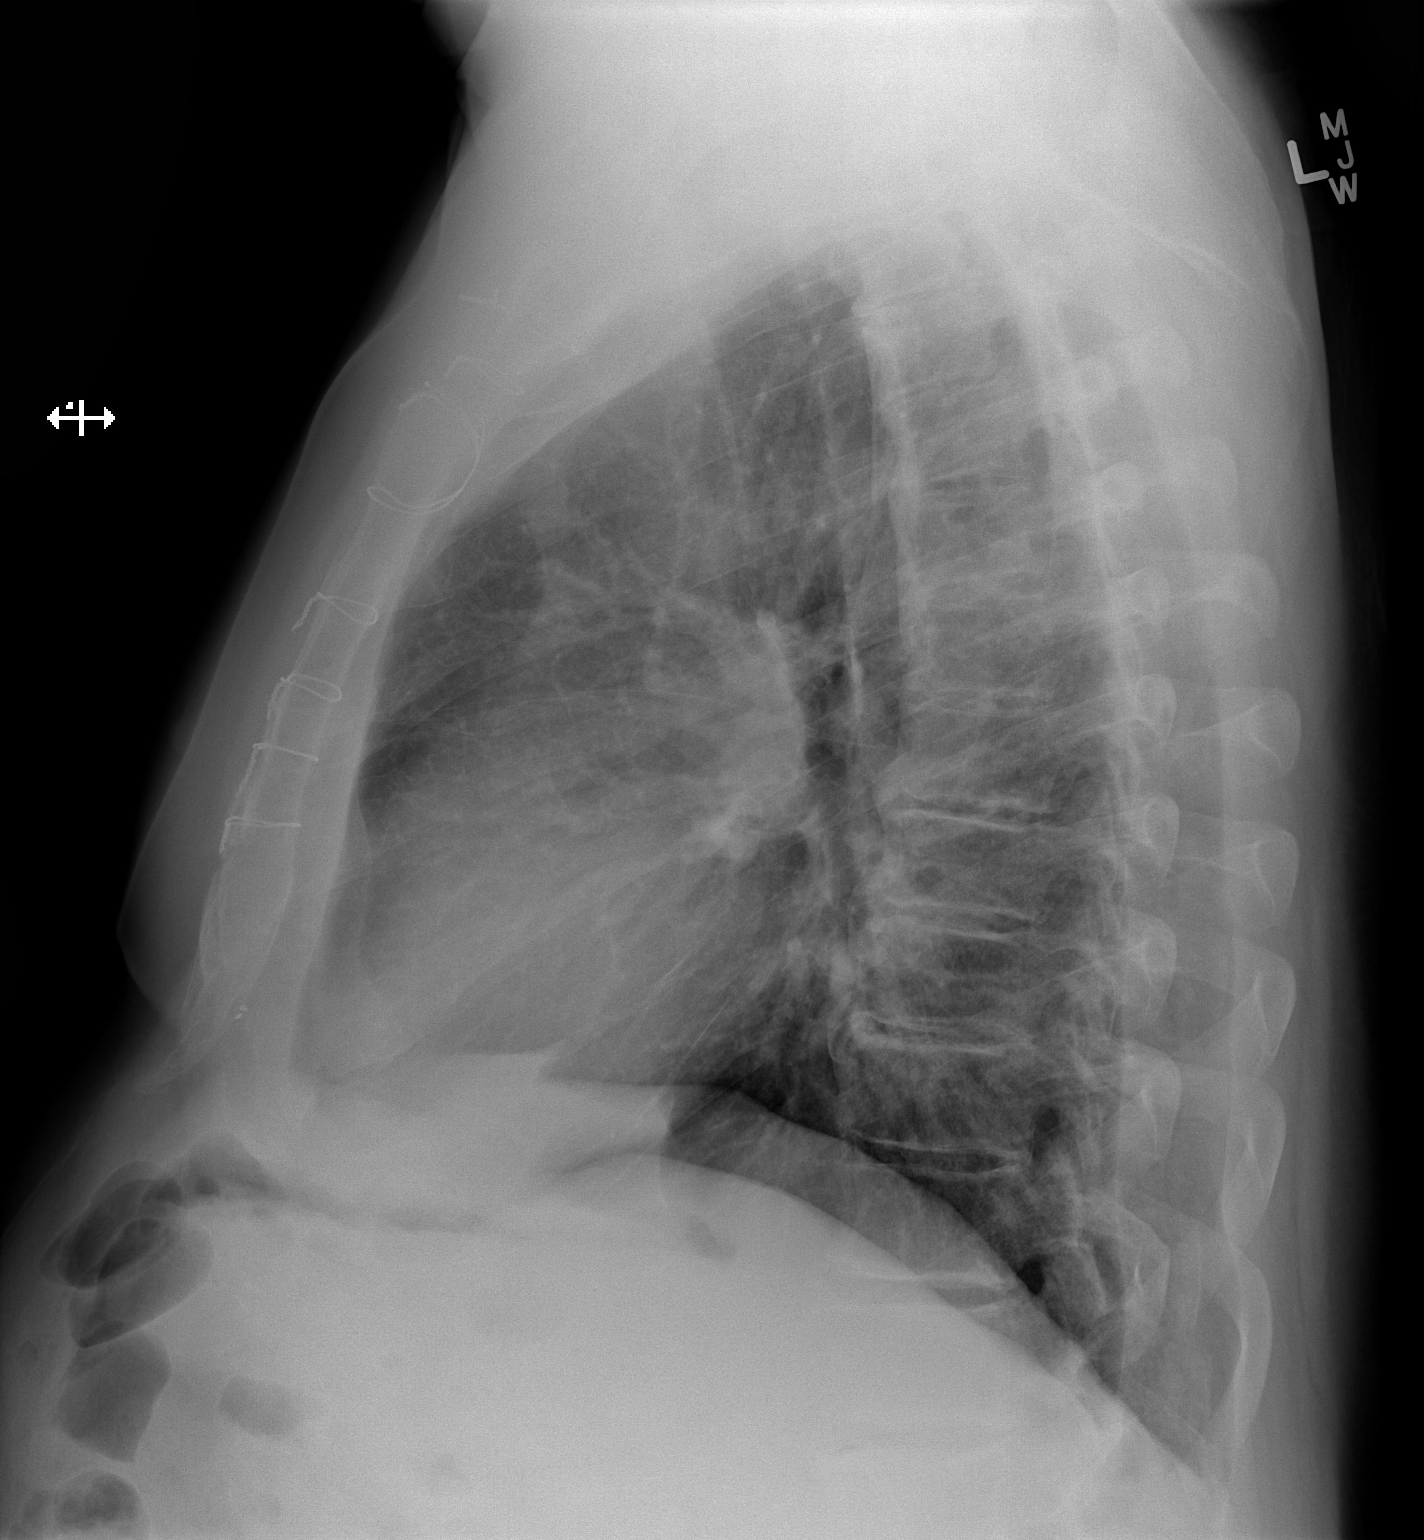

[2 of 2 positions shown; findings below may reference images not displayed]

FINDINGS: Prior CABG. Heart and mediastinal contours are within normal limits.
No focal opacities or effusions. No acute bony abnormality.
Degenerative changes in the thoracic spine.
IMPRESSION: No active cardiopulmonary disease.

## 2016-05-22 ENCOUNTER — Ambulatory Visit (HOSPITAL_COMMUNITY)
Admission: RE | Admit: 2016-05-22 | Discharge: 2016-05-22 | Disposition: A | Discharge status: Home / Self Care | Payer: Medicare Other | Source: Ambulatory Visit | Attending: Cardiology | Admitting: Cardiology

## 2016-05-22 DIAGNOSIS — I6523 Occlusion and stenosis of bilateral carotid arteries: Secondary | ICD-10-CM | POA: Diagnosis not present

## 2016-06-29 NOTE — Progress Notes (Signed)
Cardiology Office Note    Date:  07/03/2016   ID:  Scott Welch, DOB 20-Dec-1945, MRN [id number]  PCP:  Wonda Cheng, MD  Cardiologist: Sinclair Grooms, MD   Chief Complaint  Patient presents with  . Coronary Artery Disease    History of Present Illness:  Scott Welch is a 71 y.o. male who presents for CADWith prior CABG including LIMA to LAD and subsequent multivessel stenting including DES to circumflex, hypertension, hyperlipidemia, erectile dysfunction, and sleep apnea.  The patient is doing well. We discussed his initial coronary event in 2000. He had a first generation stent, Cook stent, that restenosed and ultimately LAD to LIMA to LAD in [place]. 3-4 years later he underwent PCI and circumflex stenting because of recurrent angina. He has done well since that time without recurrent coronary issues. Has noted some bilateral lower extremity swelling. Support stockings have resolved that problem.  Past Medical History:  Diagnosis Date  . Acne   . Allergic rhinitis   . Cervical radiculopathy    2008  . Coronary artery disease    one-vessel followed by Dr. Tamala Julian  . Depression   . DJD (degenerative joint disease)    [place]  . Ear congestion    ear tubes, [place]  . Erectile dysfunction   . Fractures    left elbow, right elbow, nose, multiple fingers, left clavicle  . Headache    sinus  . Hypertension   . Knee pain    followed by Dr. Berenice Primas at [facility]  . Obesity   . OSA on CPAP    since 1995. BIPAP  . Osteoarthritis    left knee, Dr. Berenice Primas  . Plantar fasciitis     Past Surgical History:  Procedure Laterality Date  . CARDIAC CATHETERIZATION     drug-eluting stents in circumflev coronary artery, 2006  . CORONARY ARTERY BYPASS GRAFT  2000   LIMA TO LAD  . ELBOW SURGERY Bilateral    Compound fracture as a kid  . HERNIA REPAIR    . KNEE ARTHROSCOPY Bilateral   . NASAL SINUS SURGERY    . NASAL SINUS SURGERY Bilateral 03/12/2016    Procedure: ENDOSCOPIC SINUS SURGERY;  Surgeon: Jerrell Belfast, MD;  Location: [facility];  Service: ENT;  Laterality: Bilateral;  . SINUS ENDO WITH FUSION Bilateral 03/12/2016   Procedure: SINUS ENDO WITH FUSION;  Surgeon: Jerrell Belfast, MD;  Location: [facility];  Service: ENT;  Laterality: Bilateral;  . TONSILLECTOMY    . TOTAL KNEE ARTHROPLASTY Left 03/18/2013   Procedure: LEFT TOTAL KNEE ARTHROPLASTY;  Surgeon: Alta Corning, MD;  Location: [facility];  Service: Orthopedics;  Laterality: Left;  . TOTAL KNEE ARTHROPLASTY Right 04/21/2014   Procedure: TOTAL KNEE ARTHROPLASTY;  Surgeon: Alta Corning, MD;  Location: [facility];  Service: Orthopedics;  Laterality: Right;  . TYMPANOSTOMY TUBE PLACEMENT      Current Medications: Outpatient Medications Prior to Visit  Medication Sig Dispense Refill  . atorvastatin (LIPITOR) 20 MG tablet Take 20 mg by mouth every evening.     . citalopram (CELEXA) 10 MG tablet Take 10 mg by mouth every evening.  0  . fexofenadine (ALLEGRA) 180 MG tablet Take 180 mg by mouth every evening.    . Flaxseed, Linseed, (FLAXSEED OIL PO) Take 1,400 mg by mouth every evening.    Marland Kitchen HYDROcodone-acetaminophen (NORCO) 5-325 MG tablet Take 1-2 tablets by mouth every 6 (six) hours as needed. 20 tablet 0  .  Menthol, Topical Analgesic, (ICY HOT EX) Apply 1 application topically daily as needed (applies for back pain).    . Misc Natural Products (OSTEO BI-FLEX TRIPLE STRENGTH PO) Take 1 tablet by mouth every evening.     . Multiple Vitamin (MULTIVITAMIN) tablet Take 1 tablet by mouth daily. Centrum Silver    . sildenafil (VIAGRA) 50 MG tablet Take 50 mg by mouth daily as needed for erectile dysfunction.    . valsartan (DIOVAN) 160 MG tablet Take 1 tablet (160 mg total) by mouth daily. 90 tablet 3  . amoxicillin-clavulanate (AUGMENTIN) 500-125 MG tablet Take 1 tablet (500 mg total) by mouth 2 (two) times daily. (Patient not taking: Reported on 07/03/2016) 20 tablet 0   No facility-administered  medications prior to visit.      Allergies:   Patient has no known allergies.   Social History   Social History  . Marital status: Married    Spouse name: N/A  . Number of children: N/A  . Years of education: N/A   Social History Main Topics  . Smoking status: Never Smoker  . Smokeless tobacco: Never Used  . Alcohol use 8.4 oz/week    14 Glasses of wine per week     Comment: Daily  . Drug use: No  . Sexual activity: Not Asked   Other Topics Concern  . None   Social History Narrative  . None     Family History:  The patient's family history includes CAD in his father and mother; Dementia in his mother; Diabetes in his sister; Gastric cancer in his sister; Glaucoma in his brother; Hypertension in his sister.   ROS:   Please see the history of present illness.    Abdominal pain for which she will be seeing a gastroenterologist. Bilateral lower extremity swelling as mentioned above. No orthopnea or PND.  All other systems reviewed and are negative.   PHYSICAL EXAM:   VS:  BP (!) 142/78 (BP Location: Left Arm)   Pulse (!) 56   Ht 6\' 2"  (1.88 m)   Wt 253 lb (114.8 kg)   BMI 32.48 kg/m    GEN: Well nourished, well developed, in no acute distress . Obese HEENT: normal  Neck: no JVD, carotid bruits, or masses Cardiac: RRR; no murmurs, rubs, or gallops. Left greater than right trace to 1+ ankle edema. Respiratory:  clear to auscultation bilaterally, normal work of breathing GI: soft, nontender, nondistended, + BS MS: no deformity or atrophy  Skin: warm and dry, no rash Neuro:  Alert and Oriented x 3, Strength and sensation are intact Psych: euthymic mood, full affect  Wt Readings from Last 3 Encounters:  07/03/16 253 lb (114.8 kg)  03/12/16 254 lb (115.2 kg)  03/11/16 254 lb (115.2 kg)      Studies/Labs Reviewed:   EKG:  EKG  Sinus bradycardia, otherwise normal with exception of first-degree AV block.  Recent Labs: 03/11/2016: BUN 19; Creatinine, Ser 1.08;  Hemoglobin 14.4; Platelets 177; Potassium 4.0; Sodium 140   Lipid Panel No results found for: CHOL, TRIG, HDL, CHOLHDL, VLDL, LDLCALC, LDLDIRECT  Additional studies/ records that were reviewed today include:  No new functional data.    ASSESSMENT:    1. Coronary artery disease involving coronary bypass graft of native heart with angina pectoris ([place])   2. Essential hypertension, benign   3. Bilateral carotid artery disease ([place])   4. Other hyperlipidemia      PLAN:  In order of problems listed above:  1. Clinically  stable. I encouraged continued aerobic activity. Note 5 decrease in exertional tolerance, chest pain, dyspnea, or other complaints. Chest discomfort and exertional dyspnea were prior anginal complaints. 2. Borderline control. We discussed aerobic activity, salt reduction in diet, and weight loss as remedies rather than increasing medical therapy. 3. Most recent Doppler study done earlier this shared did not reveal any significant obstruction. 4. Now being followed by primary care. We discussed LDL goal of less than 70.  No functional testing needed. I encouraged physical activity, weight loss, and diet control. Clinic follow-up with me in one year. Earlier if symptoms.  Medication Adjustments/Labs and Tests Ordered: Current medicines are reviewed at length with the patient today.  Concerns regarding medicines are outlined above.  Medication changes, Labs and Tests ordered today are listed in the Patient Instructions below. There are no Patient Instructions on file for this visit.   Signed, Sinclair Grooms, MD  07/03/2016 9:29 AM    [address], [place], [place]  [address] Phone: [phone]; Fax: [phone]

## 2016-06-30 DIAGNOSIS — M549 Dorsalgia, unspecified: Secondary | ICD-10-CM | POA: Diagnosis not present

## 2016-06-30 DIAGNOSIS — M62830 Muscle spasm of back: Secondary | ICD-10-CM | POA: Diagnosis not present

## 2016-06-30 DIAGNOSIS — R188 Other ascites: Secondary | ICD-10-CM | POA: Diagnosis not present

## 2016-06-30 DIAGNOSIS — R109 Unspecified abdominal pain: Secondary | ICD-10-CM | POA: Diagnosis not present

## 2016-06-30 DIAGNOSIS — I10 Essential (primary) hypertension: Secondary | ICD-10-CM | POA: Diagnosis not present

## 2016-06-30 DIAGNOSIS — K5900 Constipation, unspecified: Secondary | ICD-10-CM | POA: Diagnosis not present

## 2016-07-01 DIAGNOSIS — K760 Fatty (change of) liver, not elsewhere classified: Secondary | ICD-10-CM | POA: Diagnosis not present

## 2016-07-01 DIAGNOSIS — R109 Unspecified abdominal pain: Secondary | ICD-10-CM | POA: Diagnosis not present

## 2016-07-01 DIAGNOSIS — K8020 Calculus of gallbladder without cholecystitis without obstruction: Secondary | ICD-10-CM | POA: Diagnosis not present

## 2016-07-02 DIAGNOSIS — Z1211 Encounter for screening for malignant neoplasm of colon: Secondary | ICD-10-CM | POA: Diagnosis not present

## 2016-07-02 DIAGNOSIS — K760 Fatty (change of) liver, not elsewhere classified: Secondary | ICD-10-CM | POA: Diagnosis not present

## 2016-07-02 DIAGNOSIS — I10 Essential (primary) hypertension: Secondary | ICD-10-CM | POA: Diagnosis not present

## 2016-07-02 DIAGNOSIS — R109 Unspecified abdominal pain: Secondary | ICD-10-CM | POA: Diagnosis not present

## 2016-07-02 DIAGNOSIS — R194 Change in bowel habit: Secondary | ICD-10-CM | POA: Diagnosis not present

## 2016-07-03 ENCOUNTER — Encounter: Payer: Self-pay | Admitting: Interventional Cardiology

## 2016-07-03 ENCOUNTER — Ambulatory Visit (INDEPENDENT_AMBULATORY_CARE_PROVIDER_SITE_OTHER): Payer: Medicare Other | Admitting: Interventional Cardiology

## 2016-07-03 VITALS — BP 142/78 | HR 56 | Ht 74.0 in | Wt 253.0 lb

## 2016-07-03 DIAGNOSIS — I25709 Atherosclerosis of coronary artery bypass graft(s), unspecified, with unspecified angina pectoris: Secondary | ICD-10-CM

## 2016-07-03 DIAGNOSIS — I739 Peripheral vascular disease, unspecified: Secondary | ICD-10-CM

## 2016-07-03 DIAGNOSIS — I10 Essential (primary) hypertension: Secondary | ICD-10-CM | POA: Diagnosis not present

## 2016-07-03 DIAGNOSIS — E7849 Other hyperlipidemia: Secondary | ICD-10-CM

## 2016-07-03 DIAGNOSIS — I209 Angina pectoris, unspecified: Secondary | ICD-10-CM

## 2016-07-03 DIAGNOSIS — E784 Other hyperlipidemia: Secondary | ICD-10-CM | POA: Diagnosis not present

## 2016-07-03 DIAGNOSIS — I6523 Occlusion and stenosis of bilateral carotid arteries: Secondary | ICD-10-CM

## 2016-07-03 DIAGNOSIS — I779 Disorder of arteries and arterioles, unspecified: Secondary | ICD-10-CM

## 2016-07-03 MED ORDER — ATORVASTATIN CALCIUM 20 MG PO TABS: 20.0000 mg | ORAL_TABLET | Freq: Every evening | ORAL | 3 refills | Status: DC

## 2016-07-03 MED ORDER — VALSARTAN 160 MG PO TABS: 160.0000 mg | ORAL_TABLET | Freq: Every day | ORAL | 3 refills | Status: DC

## 2016-07-03 NOTE — Patient Instructions (Signed)

## 2016-07-08 DIAGNOSIS — R1032 Left lower quadrant pain: Secondary | ICD-10-CM | POA: Diagnosis not present

## 2016-07-08 DIAGNOSIS — Z1211 Encounter for screening for malignant neoplasm of colon: Secondary | ICD-10-CM | POA: Diagnosis not present

## 2016-07-08 DIAGNOSIS — K582 Mixed irritable bowel syndrome: Secondary | ICD-10-CM | POA: Diagnosis not present

## 2016-07-08 DIAGNOSIS — R194 Change in bowel habit: Secondary | ICD-10-CM | POA: Diagnosis not present

## 2016-07-15 DIAGNOSIS — D125 Benign neoplasm of sigmoid colon: Secondary | ICD-10-CM | POA: Diagnosis not present

## 2016-07-15 DIAGNOSIS — K5730 Diverticulosis of large intestine without perforation or abscess without bleeding: Secondary | ICD-10-CM | POA: Diagnosis not present

## 2016-07-15 DIAGNOSIS — Z1211 Encounter for screening for malignant neoplasm of colon: Secondary | ICD-10-CM | POA: Diagnosis not present

## 2016-09-10 DIAGNOSIS — Z85828 Personal history of other malignant neoplasm of skin: Secondary | ICD-10-CM | POA: Diagnosis not present

## 2016-09-10 DIAGNOSIS — D1801 Hemangioma of skin and subcutaneous tissue: Secondary | ICD-10-CM | POA: Diagnosis not present

## 2016-09-10 DIAGNOSIS — L821 Other seborrheic keratosis: Secondary | ICD-10-CM | POA: Diagnosis not present

## 2016-09-10 DIAGNOSIS — I10 Essential (primary) hypertension: Secondary | ICD-10-CM | POA: Diagnosis not present

## 2016-09-10 DIAGNOSIS — D229 Melanocytic nevi, unspecified: Secondary | ICD-10-CM | POA: Diagnosis not present

## 2016-09-10 DIAGNOSIS — L570 Actinic keratosis: Secondary | ICD-10-CM | POA: Diagnosis not present

## 2016-10-07 IMAGING — US US EXTREM LOW VENOUS*L*
1 series · 14 of 24 positions shown · non-contrast
Comparison: None.

CLINICAL DATA: Left leg swelling.  Elevated D-dimer.

EXAM:
LEFT LOWER EXTREMITY VENOUS DOPPLER ULTRASOUND
TECHNIQUE: Gray-scale sonography with graded compression, as well as color
Doppler and duplex ultrasound, were performed to evaluate the deep
venous system from the level of the common femoral vein through the
popliteal and proximal calf veins. Spectral Doppler was utilized to
evaluate flow at rest and with distal augmentation maneuvers.

[Series 1: us extrem low venous*left* · 14 of 34 slices shown]
[im 1/34]
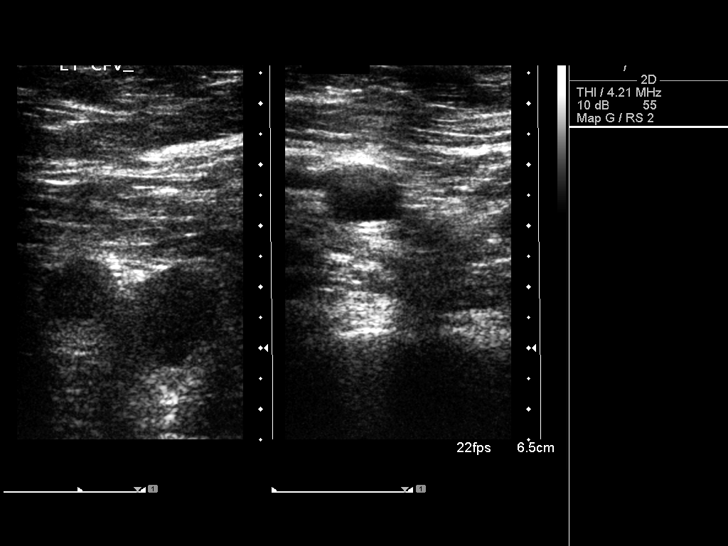
[im 3/34]
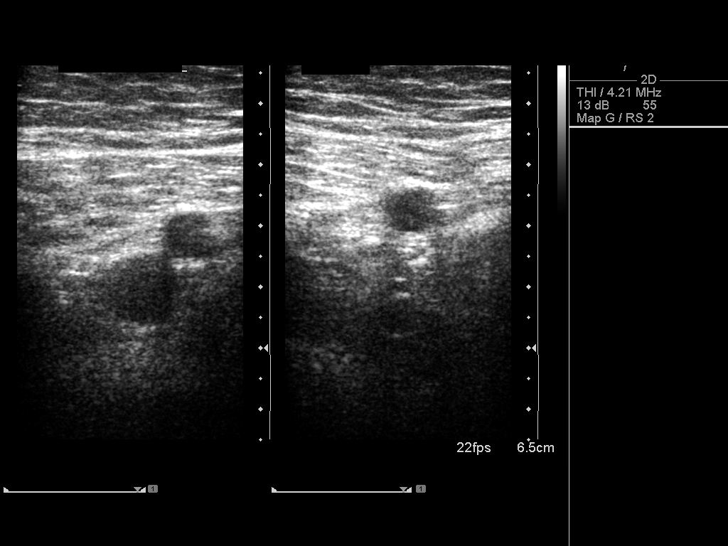
[im 6/34]
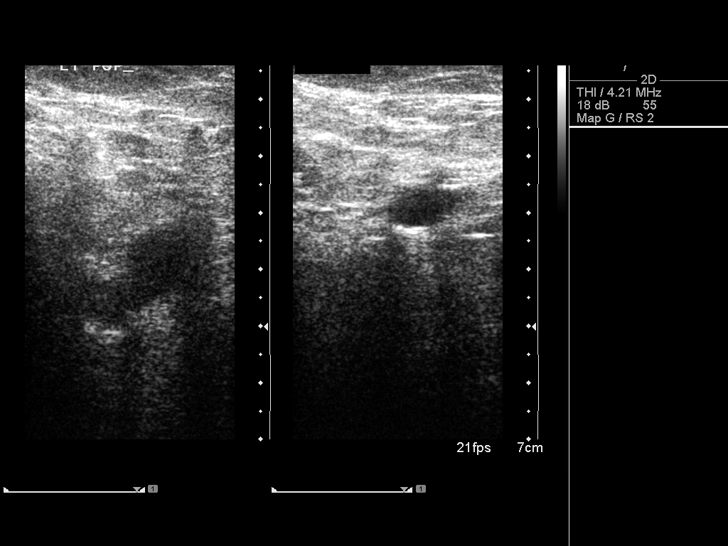
[im 9/34]
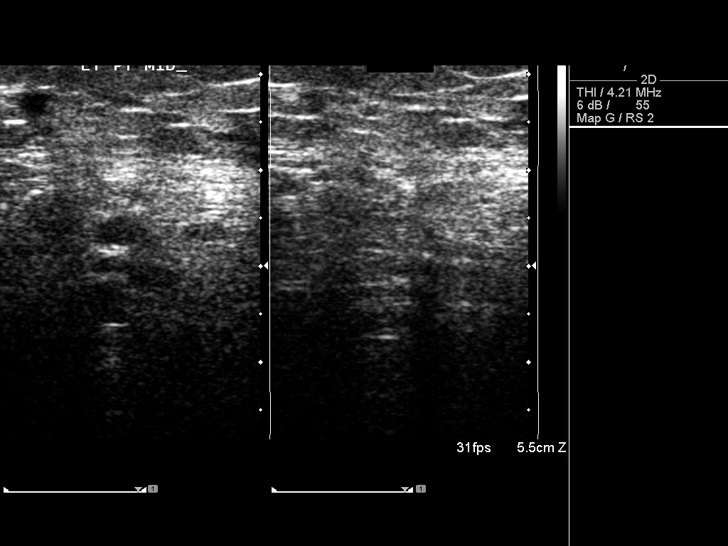
[im 11/34]
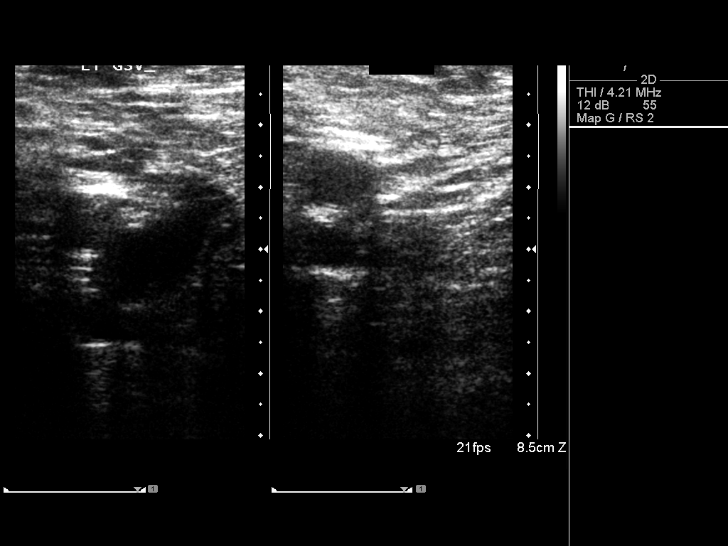
[im 13/34]
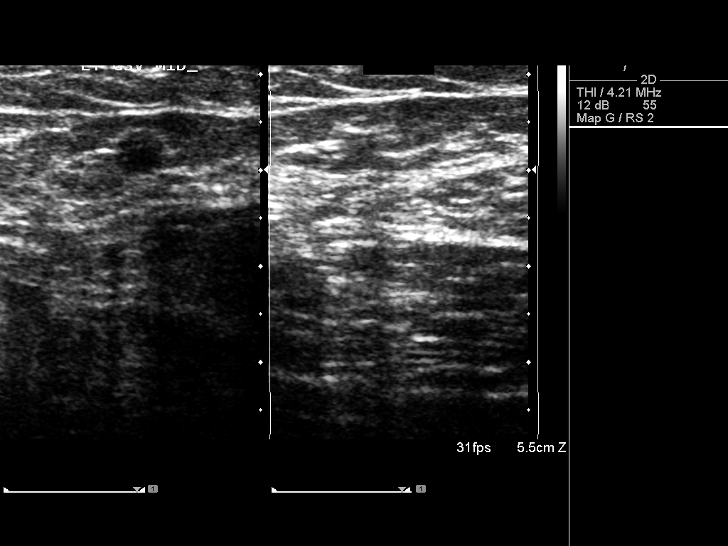
[im 16/34]
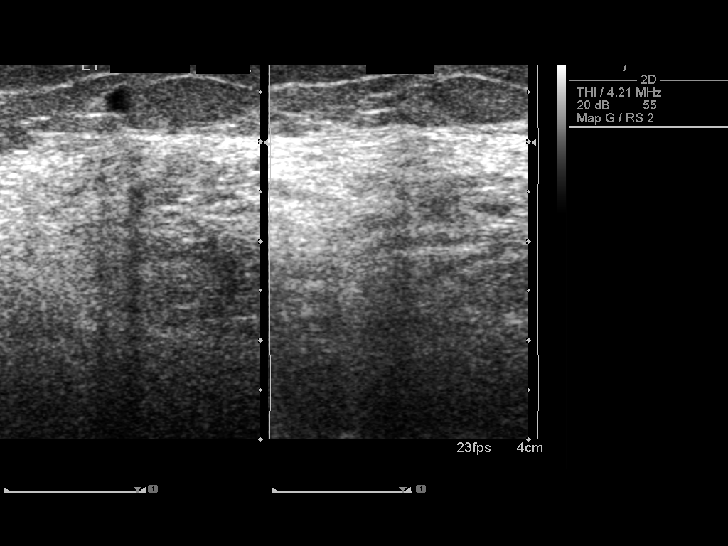
[im 18/34]
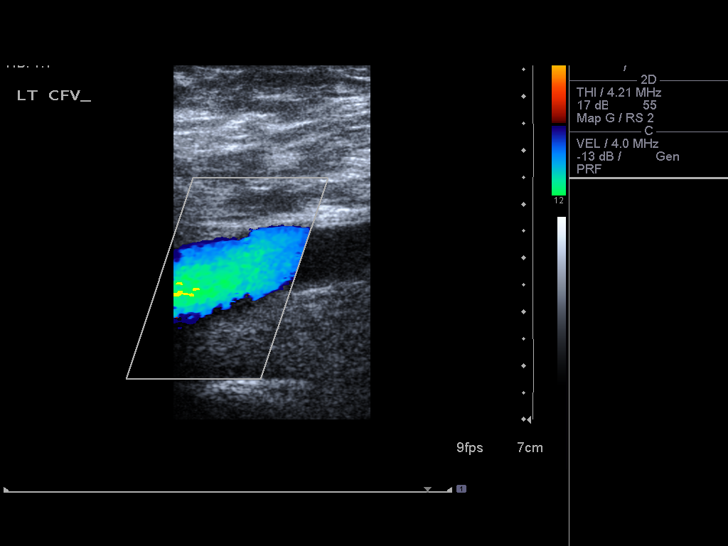
[im 21/34]
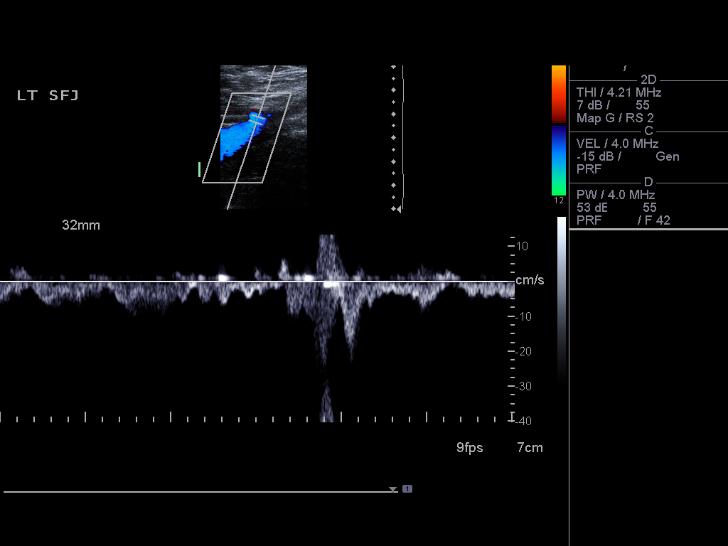
[im 23/34]
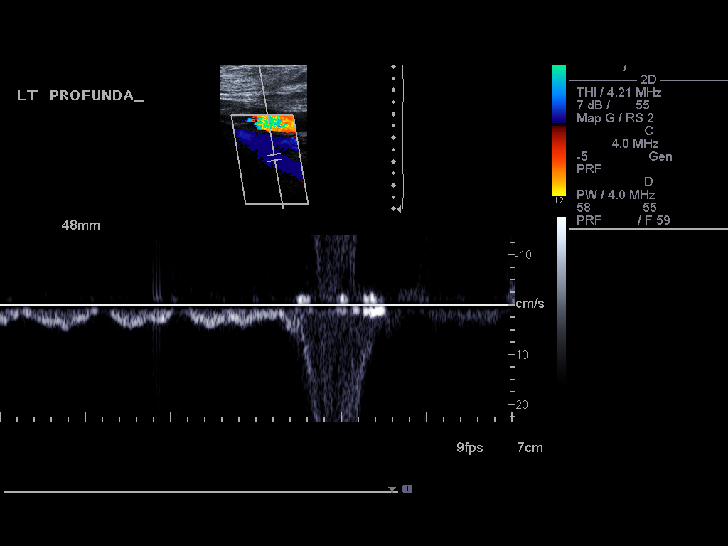
[im 26/34]
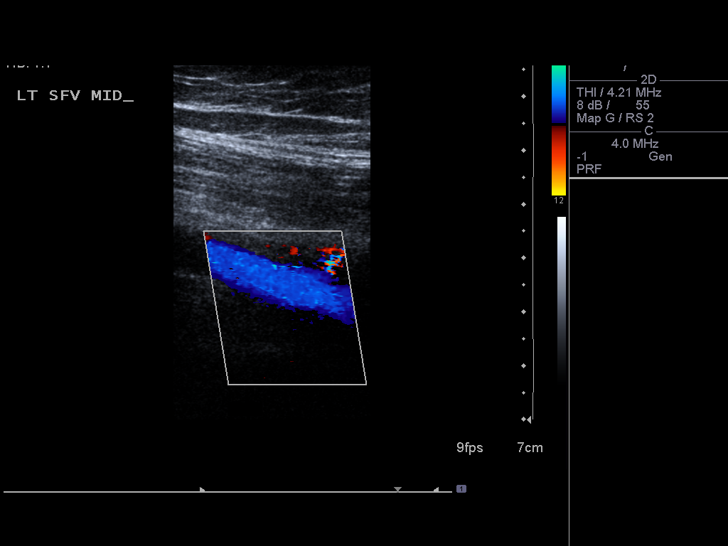
[im 28/34]
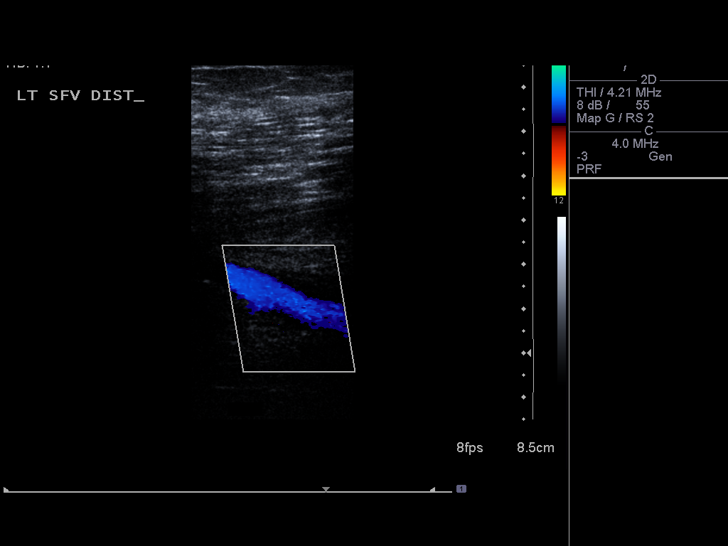
[im 31/34]
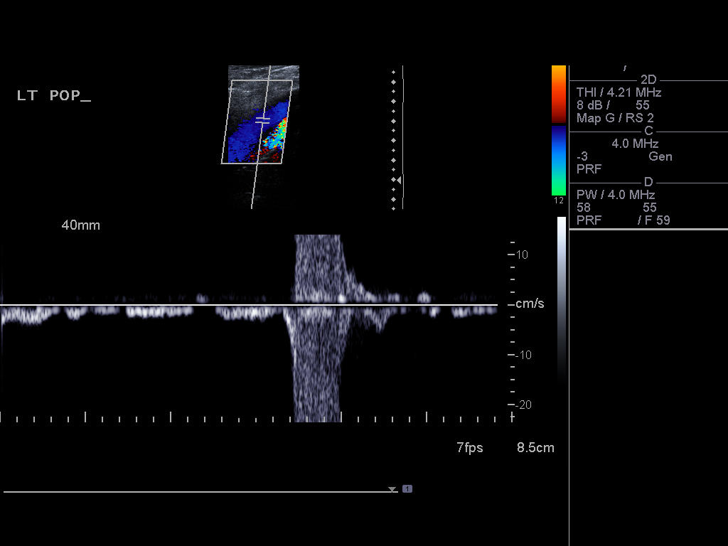
[im 34/34]
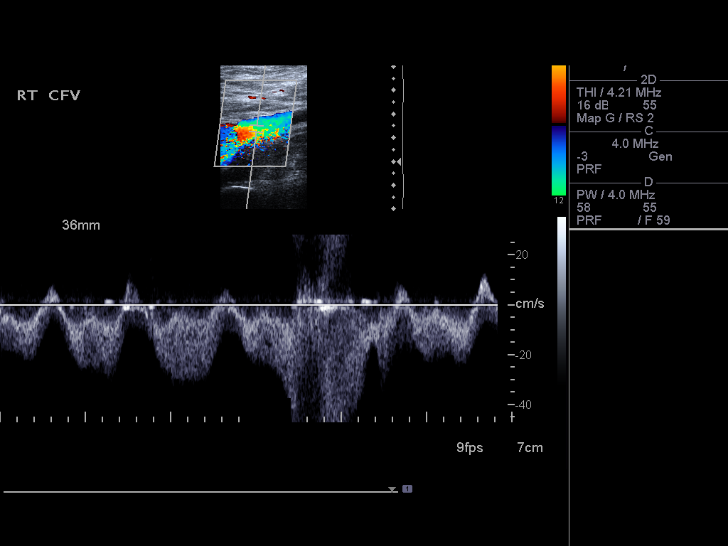

[14 of 24 positions shown; findings below may reference images not displayed]

FINDINGS: Normal compressibility, augmentation and color Doppler flow in the
left common femoral vein, left femoral vein and left popliteal vein.
The left saphenofemoral junction is patent. Visualized deep left
calf veins are patent. Left great saphenous vein is patent.

The right common femoral vein is patent.
IMPRESSION: Negative for deep vein thrombosis in the left lower extremity.

## 2017-01-29 DIAGNOSIS — Z23 Encounter for immunization: Secondary | ICD-10-CM | POA: Diagnosis not present

## 2017-02-17 DIAGNOSIS — M549 Dorsalgia, unspecified: Secondary | ICD-10-CM | POA: Diagnosis not present

## 2017-02-17 DIAGNOSIS — I2510 Atherosclerotic heart disease of native coronary artery without angina pectoris: Secondary | ICD-10-CM | POA: Diagnosis not present

## 2017-02-17 DIAGNOSIS — Z23 Encounter for immunization: Secondary | ICD-10-CM | POA: Diagnosis not present

## 2017-02-17 DIAGNOSIS — Z0000 Encounter for general adult medical examination without abnormal findings: Secondary | ICD-10-CM | POA: Diagnosis not present

## 2017-02-17 DIAGNOSIS — E785 Hyperlipidemia, unspecified: Secondary | ICD-10-CM | POA: Diagnosis not present

## 2017-02-17 DIAGNOSIS — G8929 Other chronic pain: Secondary | ICD-10-CM | POA: Diagnosis not present

## 2017-02-17 DIAGNOSIS — I10 Essential (primary) hypertension: Secondary | ICD-10-CM | POA: Diagnosis not present

## 2017-02-17 DIAGNOSIS — Z86010 Personal history of colonic polyps: Secondary | ICD-10-CM | POA: Diagnosis not present

## 2017-02-17 DIAGNOSIS — F329 Major depressive disorder, single episode, unspecified: Secondary | ICD-10-CM | POA: Diagnosis not present

## 2017-02-17 DIAGNOSIS — Z125 Encounter for screening for malignant neoplasm of prostate: Secondary | ICD-10-CM | POA: Diagnosis not present

## 2017-02-17 DIAGNOSIS — E669 Obesity, unspecified: Secondary | ICD-10-CM | POA: Diagnosis not present

## 2017-02-20 DIAGNOSIS — I2510 Atherosclerotic heart disease of native coronary artery without angina pectoris: Secondary | ICD-10-CM | POA: Diagnosis not present

## 2017-02-20 DIAGNOSIS — I10 Essential (primary) hypertension: Secondary | ICD-10-CM | POA: Diagnosis not present

## 2017-02-20 DIAGNOSIS — E669 Obesity, unspecified: Secondary | ICD-10-CM | POA: Diagnosis not present

## 2017-03-17 DIAGNOSIS — J209 Acute bronchitis, unspecified: Secondary | ICD-10-CM | POA: Diagnosis not present

## 2017-03-17 DIAGNOSIS — J4541 Moderate persistent asthma with (acute) exacerbation: Secondary | ICD-10-CM | POA: Diagnosis not present

## 2017-03-17 DIAGNOSIS — R05 Cough: Secondary | ICD-10-CM | POA: Diagnosis not present

## 2017-03-17 DIAGNOSIS — I10 Essential (primary) hypertension: Secondary | ICD-10-CM | POA: Diagnosis not present

## 2017-03-17 DIAGNOSIS — I2510 Atherosclerotic heart disease of native coronary artery without angina pectoris: Secondary | ICD-10-CM | POA: Diagnosis not present

## 2017-03-24 DIAGNOSIS — I10 Essential (primary) hypertension: Secondary | ICD-10-CM | POA: Diagnosis not present

## 2017-03-24 DIAGNOSIS — J209 Acute bronchitis, unspecified: Secondary | ICD-10-CM | POA: Diagnosis not present

## 2017-03-24 DIAGNOSIS — I2510 Atherosclerotic heart disease of native coronary artery without angina pectoris: Secondary | ICD-10-CM | POA: Diagnosis not present

## 2017-03-24 DIAGNOSIS — J4541 Moderate persistent asthma with (acute) exacerbation: Secondary | ICD-10-CM | POA: Diagnosis not present

## 2017-03-24 DIAGNOSIS — R05 Cough: Secondary | ICD-10-CM | POA: Diagnosis not present

## 2017-06-20 ENCOUNTER — Other Ambulatory Visit: Payer: Self-pay | Admitting: Interventional Cardiology

## 2017-07-16 ENCOUNTER — Encounter: Payer: Self-pay | Admitting: Interventional Cardiology

## 2017-08-05 ENCOUNTER — Ambulatory Visit: Payer: Medicare Other | Admitting: Interventional Cardiology

## 2017-08-24 NOTE — Progress Notes (Signed)
Cardiology Office Note   Date:  08/25/2017   ID:  Scott Welch, DOB 1945-12-31, MRN [id number]  PCP:  Wonda Cheng, MD  Cardiologist:  Dr. Tamala Julian    Chief Complaint  Patient presents with  . Coronary Artery Disease    does have some chest pain      History of Present Illness: Scott Welch is a 72 y.o. male who presents for CAD.  Last seen by Dr. Tamala Julian 07/03/16  CAD with prior CABG including LIMA to LAD 1993 and subsequent multivessel stenting including DES to circumflex, hypertension, hyperlipidemia, erectile dysfunction, and sleep apnea. event in 2000.   He had a first generation stent, Cook stent, that restenosed and ultimately LAD to LIMA to LAD in [place]. 3-4 years later he underwent PCI and circumflex stenting because of recurrent angina  Carotid disease.40% bil.    Today he reports the death of his wife of 74 years, occurred 2 months ago, cerebral hemorrhage.  He is selling his home and moving, he has trouble sleeping.  He will try melatonin and contact his PCP for other meds.    He does hav occ chest pain more with exertion.  No associated symptoms. But bothersome.  Also some side pain with playing golf.  He is active and walks 4-5 days per week, plays golf.  He is trying nutra system for wt  Loss.  His daughters are helping him through this time.       Past Medical History:  Diagnosis Date  . Acne   . Allergic rhinitis   . Cervical radiculopathy    2008  . Coronary artery disease    one-vessel followed by Dr. Tamala Julian  . Depression   . DJD (degenerative joint disease)    [place]  . Ear congestion    ear tubes, [place]  . Erectile dysfunction   . Fractures    left elbow, right elbow, nose, multiple fingers, left clavicle  . Headache    sinus  . Hypertension   . Knee pain    followed by Dr. Berenice Primas at [facility]  . Obesity   . OSA on CPAP    since 1995. BIPAP  . Osteoarthritis    left knee, Dr. Berenice Primas  . Plantar fasciitis      Past Surgical History:  Procedure Laterality Date  . CARDIAC CATHETERIZATION     drug-eluting stents in circumflev coronary artery, 2006  . CORONARY ARTERY BYPASS GRAFT  2000   LIMA TO LAD  . ELBOW SURGERY Bilateral    Compound fracture as a kid  . HERNIA REPAIR    . KNEE ARTHROSCOPY Bilateral   . NASAL SINUS SURGERY    . NASAL SINUS SURGERY Bilateral 03/12/2016   Procedure: ENDOSCOPIC SINUS SURGERY;  Surgeon: Jerrell Belfast, MD;  Location: [facility];  Service: ENT;  Laterality: Bilateral;  . SINUS ENDO WITH FUSION Bilateral 03/12/2016   Procedure: SINUS ENDO WITH FUSION;  Surgeon: Jerrell Belfast, MD;  Location: [facility];  Service: ENT;  Laterality: Bilateral;  . TONSILLECTOMY    . TOTAL KNEE ARTHROPLASTY Left 03/18/2013   Procedure: LEFT TOTAL KNEE ARTHROPLASTY;  Surgeon: Alta Corning, MD;  Location: [facility];  Service: Orthopedics;  Laterality: Left;  . TOTAL KNEE ARTHROPLASTY Right 04/21/2014   Procedure: TOTAL KNEE ARTHROPLASTY;  Surgeon: Alta Corning, MD;  Location: [facility];  Service: Orthopedics;  Laterality: Right;  . TYMPANOSTOMY TUBE PLACEMENT       Current Outpatient Medications  Medication Sig Dispense Refill  . aspirin EC 81 MG tablet Take 81 mg by mouth daily.    Marland Kitchen atorvastatin (LIPITOR) 20 MG tablet Take 1 tablet (20 mg total) by mouth every evening. Please keep upcoming appt for future refills. Thank you 90 tablet 0  . citalopram (CELEXA) 10 MG tablet Take 10 mg by mouth every evening.  0  . fexofenadine (ALLEGRA) 180 MG tablet Take 180 mg by mouth every evening.    . Flaxseed, Linseed, (FLAXSEED OIL PO) Take 1,400 mg by mouth every evening.    Marland Kitchen HYDROcodone-acetaminophen (NORCO) 5-325 MG tablet Take 1-2 tablets by mouth every 6 (six) hours as needed. 20 tablet 0  . lisinopril (PRINIVIL,ZESTRIL) 20 MG tablet Take 1 tablet by mouth daily.    . Menthol, Topical Analgesic, (ICY HOT EX) Apply 1 application topically daily as needed (applies for back pain).    . Misc Natural  Products (OSTEO BI-FLEX TRIPLE STRENGTH PO) Take 1 tablet by mouth every evening.     . Multiple Vitamin (MULTIVITAMIN) tablet Take 1 tablet by mouth daily. Centrum Silver    . sildenafil (VIAGRA) 50 MG tablet Take 50 mg by mouth daily as needed for erectile dysfunction.     No current facility-administered medications for this visit.     Allergies:   Patient has no known allergies.    Social History:  The patient  reports that he has never smoked. He has never used smokeless tobacco. He reports that he drinks about 8.4 oz of alcohol per week. He reports that he does not use drugs.   Family History:  The patient's family history includes CAD in his father and mother; Dementia in his mother; Diabetes in his sister; Gastric cancer in his sister; Glaucoma in his brother; Hypertension in his sister.    ROS:  General:no colds or fevers, some weight gain Skin:no rashes or ulcers HEENT:no blurred vision, no congestion CV:see HPI PUL:see HPI GI:no diarrhea constipation or melena, no indigestion GU:no hematuria, no dysuria MS:no joint pain hx of bil knee replacemnts, no claudication Neuro:no syncope, no lightheadedness Endo:no diabetes, no thyroid disease  Wt Readings from Last 3 Encounters:  08/25/17 261 lb (118.4 kg)  07/03/16 253 lb (114.8 kg)  03/12/16 254 lb (115.2 kg)     PHYSICAL EXAM: VS:  BP 122/78   Pulse (!) 58   Ht 6\' 2"  (1.88 m)   Wt 261 lb (118.4 kg)   SpO2 95%   BMI 33.51 kg/m  , BMI Body mass index is 33.51 kg/m. General:Pleasant affect, NAD Skin:Warm and dry, brisk capillary refill HEENT:normocephalic, sclera clear, mucus membranes moist Neck:supple, no JVD, no bruits  Heart:S1S2 RRR without murmur, gallup, rub or click Lungs:clear without rales, rhonchi, or wheezes KXF:GHWE, non tender, + BS, do not palpate liver spleen or masses Ext:1+ Lt lower ext edema chronic, none on the right, 2+ pedal pulses, 2+ radial pulses Neuro:alert and oriented X 3, MAE, follows  commands, + facial symmetry    EKG:  EKG is ordered today. The ekg ordered today demonstrates SR 1st degree AV block. No acute changes    Recent Labs: No results found for requested labs within last 8760 hours.    Lipid Panel No results found for: CHOL, TRIG, HDL, CHOLHDL, VLDL, LDLCALC, LDLDIRECT   Lipids done in 02/2017 with HDL 61 and LDL 60   Other studies Reviewed: Additional studies/ records that were reviewed today include: no recent.   ASSESSMENT AND PLAN:  1.  Chest  pain, with hx of CAD and CABG 1992 and several stents since that time.  Will do exercise myoivew to eval for ischemia.    2.   CAD with CHx CABG LIMA to LAD, and more recent stents in [place] and [place].   .   3.   HLD per PCP but well controlled on statin  4.    HTN controlled was intolerant to diovan.  5.    Carotid disease will recheck dopplers.  He will follow up with Dr. Tamala Julian in 6 months unless problems prior to that time.   Current medicines are reviewed with the patient today.  The patient Has no concerns regarding medicines.  The following changes have been made:  See above Labs/ tests ordered today include:see above  Disposition:   FU:  see above  Signed, Cecilie Kicks, NP  08/25/2017 10:42 AM    [address], [place], [place]  [place], [place] Phone: [phone]; Fax: [phone]

## 2017-08-25 ENCOUNTER — Other Ambulatory Visit: Payer: Self-pay | Admitting: Cardiology

## 2017-08-25 ENCOUNTER — Ambulatory Visit (INDEPENDENT_AMBULATORY_CARE_PROVIDER_SITE_OTHER): Payer: Medicare Other | Admitting: Cardiology

## 2017-08-25 ENCOUNTER — Encounter: Payer: Self-pay | Admitting: *Deleted

## 2017-08-25 ENCOUNTER — Encounter: Payer: Self-pay | Admitting: Cardiology

## 2017-08-25 VITALS — BP 122/78 | HR 58 | Ht 74.0 in | Wt 261.0 lb

## 2017-08-25 DIAGNOSIS — E7849 Other hyperlipidemia: Secondary | ICD-10-CM

## 2017-08-25 DIAGNOSIS — I6523 Occlusion and stenosis of bilateral carotid arteries: Secondary | ICD-10-CM

## 2017-08-25 DIAGNOSIS — R079 Chest pain, unspecified: Secondary | ICD-10-CM

## 2017-08-25 DIAGNOSIS — I10 Essential (primary) hypertension: Secondary | ICD-10-CM

## 2017-08-25 DIAGNOSIS — I25709 Atherosclerosis of coronary artery bypass graft(s), unspecified, with unspecified angina pectoris: Secondary | ICD-10-CM | POA: Diagnosis not present

## 2017-08-25 NOTE — Patient Instructions (Signed)
Medication Instructions:  Your physician recommends that you continue on your current medications as directed. Please refer to the Current Medication list given to you today.   Labwork: None ordered  Testing/Procedures: Your physician has requested that you have en exercise stress myoview. For further information please visit [email or web address]. Please follow instruction sheet, as given.  Your physician has requested that you have a carotid duplex. This test is an ultrasound of the carotid arteries in your neck. It looks at blood flow through these arteries that supply the brain with blood. Allow one hour for this exam. There are no restrictions or special instructions.    Follow-Up: Your physician wants you to follow-up in: [place] DR. Gaspar Bidding will receive a reminder letter in the mail two months in advance. If you don't receive a letter, please call our office to schedule the follow-up appointment.   Any Other Special Instructions Will Be Listed Below (If Applicable).    Exercise Stress Electrocardiogram An exercise stress electrocardiogram is a test to check how blood flows to your heart. It is done to find areas of poor blood flow. You will need to walk on a treadmill for this test. The electrocardiogram will record your heartbeat when you are at rest and when you are exercising. What happens before the procedure?  Do not have drinks with caffeine or foods with caffeine for 24 hours before the test, or as told by your doctor. This includes coffee, tea (even decaf tea), sodas, chocolate, and cocoa.  Follow your doctor's instructions about eating and drinking before the test.  Ask your doctor what medicines you should or should not take before the test. Take your medicines with water unless told by your doctor not to.  If you use an inhaler, bring it with you to the test.  Bring a snack to eat after the test.  Do not  smoke for 4 hours before the test.  Do not put  lotions, powders, creams, or oils on your chest before the test.  Wear comfortable shoes and clothing. What happens during the procedure?  You will have patches put on your chest. Small areas of your chest may need to be shaved. Wires will be connected to the patches.  Your heart rate will be watched while you are resting and while you are exercising.  You will walk on the treadmill. The treadmill will slowly get faster to raise your heart rate.  The test will take about 1-2 hours. What happens after the procedure?  Your heart rate and blood pressure will be watched after the test.  You may return to your normal diet, activities, and medicines or as told by your doctor. This information is not intended to replace advice given to you by your health care provider. Make sure you discuss any questions you have with your health care provider. Document Released: 09/17/2007 Document Revised: 11/28/2015 Document Reviewed: 12/06/2012 Elsevier Interactive Patient Education  2018 [employer].   Carotid Artery Disease The carotid arteries are the two main arteries on either side of the neck that supply blood to the brain. Carotid artery disease, also called carotid artery stenosis, is the narrowing or blockage of one or both carotid arteries. Carotid artery disease increases your risk for a stroke or a transient ischemic attack (TIA). A TIA is an episode in which a waxy, fatty substance that accumulates within the artery (plaque) blocks blood flow to the brain. A TIA is considered a "warning stroke." What are  the causes?  Buildup of plaque inside the carotid arteries (atherosclerosis) (common).  A weakened outpouching in an artery (aneurysm).  Inflammation of the carotid artery (arteritis).  A fibrous growth within the carotid artery (fibromuscular dysplasia).  Tissue death within the carotid artery due to radiation treatment (post-radiation necrosis).  Decreased blood flow due to spasms of  the carotid artery (vasospasm).  Separation of the walls of the carotid artery (carotid dissection). What increases the risk?  High cholesterol (dyslipidemia).  High blood pressure (hypertension).  Smoking.  Obesity.  Diabetes.  Family history of cardiovascular disease.  Inactivity or lack of regular exercise.  Being male. Men have an increased risk of developing atherosclerosis earlier in life than women. What are the signs or symptoms? Carotid artery disease does not cause symptoms. How is this diagnosed? Diagnosis of carotid artery disease may include:  A physical exam. Your health care provider may hear an abnormal sound (bruit) when listening to the carotid arteries.  Specific tests that look at the blood flow in the carotid arteries. These tests include: ? Carotid artery ultrasonography. ? Carotid or cerebral angiography. ? Computerized tomographic angiography (CTA). ? Magnetic resonance angiography (MRA).  How is this treated? Treatment of carotid artery disease can include a combination of treatments. Treatment options include:  Surgery. You may have: ? A carotid endarterectomy. This is a surgery to remove the blockages in the carotid arteries. ? A carotid angioplasty with stenting. This is a nonsurgical interventional procedure. A wire mesh (stent) is used to widen the blocked carotid arteries.  Medicines to control blood pressure, cholesterol, and reduce blood clotting (antiplatelet therapy).  Adjusting your diet.  Lifestyle changes such as: ? Quitting smoking. ? Exercising as tolerated or as directed by your health care provider. ? Controlling and maintaining a good blood pressure. ? Keeping cholesterol levels under control.  Follow these instructions at home:  Take medicines only as directed by your health care provider. Make sure you understand all your medicine instructions. Do not stop your medicines without talking to your health care  provider.  Follow your health care provider's diet instructions. It is important to eat a healthy diet that is low in saturated fats and includes plenty of fresh fruits, vegetables, and lean meats. High-fat, high-sodium foods as well as foods that are fried, overly processed, or have poor nutritional value should be avoided.  Maintain a healthy weight.  Stay physically active. It is recommended that you get at least 30 minutes of activity every day.  Do not use any tobacco products including cigarettes, chewing tobacco, or electronic cigarettes. If you need help quitting, ask your health care provider.  Limit alcohol use to: ? No more than 2 drinks per day for men. ? No more than 1 drink per day for nonpregnant women.  Do not use illegal drugs.  Keep all follow-up visits as directed by your health care provider. Get help right away if: You develop TIA or stroke symptoms. These include:  Sudden weakness or numbness on one side of the body, such as in the face, arm, or leg.  Sudden confusion.  Trouble speaking (aphasia) or understanding.  Sudden trouble seeing out of one or both eyes.  Sudden trouble walking.  Dizziness or feeling like you might faint.  Loss of balance or coordination.  Sudden severe headache with no known cause.  Sudden trouble swallowing (dysphagia).  If you have any of these symptoms, call your local emergency services (911 in [place] Do not drive yourself to  the clinic or hospital. This is a medical emergency. This information is not intended to replace advice given to you by your health care provider. Make sure you discuss any questions you have with your health care provider. Document Released: 06/23/2011 Document Revised: 09/06/2015 Document Reviewed: 09/29/2012 Elsevier Interactive Patient Education  2017 [employer].   If you need a refill on your cardiac medications before your next appointment, please call your pharmacy.

## 2017-08-27 ENCOUNTER — Ambulatory Visit (HOSPITAL_COMMUNITY)
Admission: RE | Admit: 2017-08-27 | Discharge: 2017-08-27 | Disposition: A | Discharge status: Home / Self Care | Payer: Medicare Other | Source: Ambulatory Visit | Attending: Cardiology | Admitting: Cardiology

## 2017-08-27 DIAGNOSIS — I10 Essential (primary) hypertension: Secondary | ICD-10-CM | POA: Diagnosis not present

## 2017-08-27 DIAGNOSIS — E785 Hyperlipidemia, unspecified: Secondary | ICD-10-CM | POA: Insufficient documentation

## 2017-08-27 DIAGNOSIS — I6523 Occlusion and stenosis of bilateral carotid arteries: Secondary | ICD-10-CM | POA: Diagnosis present

## 2017-08-27 DIAGNOSIS — I2510 Atherosclerotic heart disease of native coronary artery without angina pectoris: Secondary | ICD-10-CM | POA: Diagnosis not present

## 2017-08-29 ENCOUNTER — Other Ambulatory Visit: Payer: Self-pay | Admitting: Interventional Cardiology

## 2017-08-31 ENCOUNTER — Telehealth (HOSPITAL_COMMUNITY): Payer: Self-pay | Admitting: *Deleted

## 2017-08-31 ENCOUNTER — Telehealth: Payer: Self-pay

## 2017-08-31 NOTE — Telephone Encounter (Signed)
Left message on voicemail per DPR in reference to upcoming appointment scheduled on 09/03/17 at 1015 with detailed instructions given per Myocardial Perfusion Study Information Sheet for the test. LM to arrive 15 minutes early, and that it is imperative to arrive on time for appointment to keep from having the test rescheduled. If you need to cancel or reschedule your appointment, please call the office within 24 hours of your appointment. Failure to do so may result in a cancellation of your appointment, and a $50 no show fee. Phone number given for call back for any questions.

## 2017-08-31 NOTE — Telephone Encounter (Signed)
lmtcb for carotid doppler results

## 2017-08-31 NOTE — Telephone Encounter (Signed)
-----   Message from Isaiah Serge, NP sent at 08/28/2017 12:15 PM EDT ----- Stable carotid disease on dopplers reassuring.

## 2017-09-03 ENCOUNTER — Encounter (HOSPITAL_COMMUNITY): Payer: Medicare Other

## 2017-09-10 ENCOUNTER — Encounter (HOSPITAL_COMMUNITY): Payer: Medicare Other

## 2017-10-23 ENCOUNTER — Telehealth (HOSPITAL_COMMUNITY): Payer: Self-pay | Admitting: Cardiology

## 2017-10-23 NOTE — Telephone Encounter (Signed)
09/21/17 Patient cancelled stated he is has alot of debt and stress right now. He will call us back to R/S

## 2018-02-21 NOTE — Progress Notes (Signed)
Cardiology Office Note:    Date:  02/23/2018   ID:  EVEREST BROD, DOB 10-23-1945, MRN [id number]  PCP:  Wonda Cheng, MD  Cardiologist:  Sinclair Grooms, MD   Referring MD: Wonda Cheng, MD   Chief Complaint  Patient presents with  . Coronary Artery Disease  . Leg Swelling    History of Present Illness:    ORAN DILLENBURG is a 72 y.o. male with a hx of CAD prior CABG including LIMA to LAD 1993, and subsequent multivessel stenting including DES to circumflex, hypertension, hyperlipidemia, erectile dysfunction, and sleep apnea.  Complaining of left lower extremity swelling.  Lower extremity swelling has been going on for 4 to 5 days.  He was pheasant hunting in [place].  Did a lot of walking.  Had a long flight home and noticed that the leg was swollen during the flight.  He says that his swelling is perhaps slightly better today than 2 or 3 days ago but still obviously larger than the of the leg.  Chest pressure occurs randomly.  Of note, while walking during pheasant hunting, he had no chest complaints or shortness of breath.  Feels he walked up to 9 miles per day.  Certain activities and emotional stress can cause chest discomfort.  No bleeding tendencies that he is aware of.  He denies orthopnea, PND, hemoptysis, chills, and fever.  He has not had syncope.  No palpitations have been noticeable.  Past Medical History:  Diagnosis Date  . Acne   . Allergic rhinitis   . Cervical radiculopathy    2008  . Coronary artery disease    one-vessel followed by Dr. Tamala Julian  . Depression   . DJD (degenerative joint disease)    [place]  . Ear congestion    ear tubes, [place]  . Erectile dysfunction   . Fractures    left elbow, right elbow, nose, multiple fingers, left clavicle  . Headache    sinus  . Hypertension   . Knee pain    followed by Dr. Berenice Primas at [facility]  . Obesity   . OSA on CPAP    since 1995. BIPAP  . Osteoarthritis    left knee, Dr. Berenice Primas  .  Plantar fasciitis     Past Surgical History:  Procedure Laterality Date  . CARDIAC CATHETERIZATION     drug-eluting stents in circumflev coronary artery, 2006  . CORONARY ARTERY BYPASS GRAFT  2000   LIMA TO LAD  . ELBOW SURGERY Bilateral    Compound fracture as a kid  . HERNIA REPAIR    . KNEE ARTHROSCOPY Bilateral   . NASAL SINUS SURGERY    . NASAL SINUS SURGERY Bilateral 03/12/2016   Procedure: ENDOSCOPIC SINUS SURGERY;  Surgeon: Jerrell Belfast, MD;  Location: [place];  Service: ENT;  Laterality: Bilateral;  . SINUS ENDO WITH FUSION Bilateral 03/12/2016   Procedure: SINUS ENDO WITH FUSION;  Surgeon: Jerrell Belfast, MD;  Location: [place];  Service: ENT;  Laterality: Bilateral;  . TONSILLECTOMY    . TOTAL KNEE ARTHROPLASTY Left 03/18/2013   Procedure: LEFT TOTAL KNEE ARTHROPLASTY;  Surgeon: Alta Corning, MD;  Location: [place];  Service: Orthopedics;  Laterality: Left;  . TOTAL KNEE ARTHROPLASTY Right 04/21/2014   Procedure: TOTAL KNEE ARTHROPLASTY;  Surgeon: Alta Corning, MD;  Location: [place];  Service: Orthopedics;  Laterality: Right;  . TYMPANOSTOMY TUBE PLACEMENT      Current Medications: Current Meds  Medication Sig  .  aspirin EC 81 MG tablet Take 81 mg by mouth daily.  Marland Kitchen atorvastatin (LIPITOR) 20 MG tablet Take 1 tablet (20 mg total) by mouth every evening.  . citalopram (CELEXA) 10 MG tablet Take 10 mg by mouth every evening.  Marland Kitchen lisinopril (PRINIVIL,ZESTRIL) 20 MG tablet Take 1 tablet by mouth daily.  . Misc Natural Products (OSTEO BI-FLEX TRIPLE STRENGTH PO) Take 1 tablet by mouth every evening.   . Multiple Vitamin (MULTIVITAMIN) tablet Take 1 tablet by mouth daily. Centrum Silver  . sildenafil (VIAGRA) 50 MG tablet Take 50 mg by mouth daily as needed for erectile dysfunction.     Allergies:   Patient has no known allergies.   Social History   Socioeconomic History  . Marital status: Married    Spouse name: Not on file  . Number of children: Not on file  . Years of  education: Not on file  . Highest education level: Not on file  Occupational History  . Not on file  Social Needs  . Financial resource strain: Not on file  . Food insecurity:    Worry: Not on file    Inability: Not on file  . Transportation needs:    Medical: Not on file    Non-medical: Not on file  Tobacco Use  . Smoking status: Never Smoker  . Smokeless tobacco: Never Used  Substance and Sexual Activity  . Alcohol use: Yes    Alcohol/week: 14.0 standard drinks    Types: 14 Glasses of wine per week    Comment: Daily  . Drug use: No  . Sexual activity: Not on file  Lifestyle  . Physical activity:    Days per week: Not on file    Minutes per session: Not on file  . Stress: Not on file  Relationships  . Social connections:    Talks on phone: Not on file    Gets together: Not on file    Attends religious service: Not on file    Active member of club or organization: Not on file    Attends meetings of clubs or organizations: Not on file    Relationship status: Not on file  Other Topics Concern  . Not on file  Social History Narrative  . Not on file     Family History: The patient's family history includes CAD in his father and mother; Dementia in his mother; Diabetes in his sister; Gastric cancer in his sister; Glaucoma in his brother; Hypertension in his sister.  ROS:   Please see the history of present illness.    Anxiety, joint swelling, occasional chest pressure.  All other systems reviewed and are negative.  EKGs/Labs/Other Studies Reviewed:    The following studies were reviewed today: Reviewed laboratory data from [place]. November 2018 LDL cholesterol calculated was 62.  EKG:  EKG is  ordered today.  The ekg ordered today demonstrates sinus rhythm with first-degree AV block was noted on EKG performed in May 2019.  Recent Labs: No results found for requested labs within last 8760 hours.  Recent Lipid Panel No results found for: CHOL, TRIG, HDL, CHOLHDL,  VLDL, LDLCALC, LDLDIRECT  Physical Exam:    VS:  BP (!) 144/84   Pulse (!) 57   Ht 6\' 2"  (1.88 m)   Wt 230 lb 3.2 oz (104.4 kg)   SpO2 96%   BMI 29.56 kg/m     Wt Readings from Last 3 Encounters:  02/23/18 230 lb 3.2 oz (104.4 kg)  08/25/17 261 lb (  118.4 kg)  07/03/16 253 lb (114.8 kg)     GEN:  Well nourished, well developed in no acute distress HEENT: Normal NECK: No JVD. LYMPHATICS: No lymphadenopathy CARDIAC: RRR, no murmur, no gallop, 2-3+ left ankle to knee pitting edema.  Nontender.  Not red.  Asymmetric when compared to right lower extremity.  VASCULAR: 2+ bilateral radial and carotid pulses.  No bruits. RESPIRATORY:  Clear to auscultation without rales, wheezing or rhonchi  ABDOMEN: Soft, non-tender, non-distended, No pulsatile mass, MUSCULOSKELETAL: No deformity  SKIN: Warm and dry NEUROLOGIC:  Alert and oriented x 3 PSYCHIATRIC:  Normal affect   ASSESSMENT:    1. Coronary artery disease involving coronary bypass graft of native heart with angina pectoris ([place])   2. Essential hypertension, benign   3. Obstructive sleep apnea   4. Other hyperlipidemia   5. Swelling of lower leg    PLAN:    In order of problems listed above:  1. He needs to have stress imaging with pharmacologic agent.  We need to risk stratify to exclude high risk given his varying symptoms.  Risk factor modification discussed in detail. 2. Target blood pressure 130/80 mmHg.  Slightly above target.  Low-salt diet discussed. 3. Compliant with CPAP. 4. LDL target less than 70. 5. Unilateral lower extremity swelling, left leg.  Recent long plane flight.  Clinical concern for DVT.  Urgent lower extremity venous Doppler study and anticoagulation if indicated.  Overall education and awareness concerning primary/secondary risk prevention was discussed in detail: LDL less than 70, hemoglobin A1c less than 7, blood pressure target less than 130/80 mmHg, >150 minutes of moderate aerobic activity per  week, avoidance of smoking, weight control (via diet and exercise), and continued surveillance/management of/for obstructive sleep apnea.  Greater than 50% of the time during this office visit was spent in education, counseling, and coordination of care related to underlying disease process and testing as outlined.    Medication Adjustments/Labs and Tests Ordered: Current medicines are reviewed at length with the patient today.  Concerns regarding medicines are outlined above.  Orders Placed This Encounter  Procedures  . MYOCARDIAL PERFUSION IMAGING   No orders of the defined types were placed in this encounter.   Patient Instructions  Medication Instructions:  Your physician recommends that you continue on your current medications as directed. Please refer to the Current Medication list given to you today.  If you need a refill on your cardiac medications before your next appointment, please call your pharmacy.   Lab work: None If you have labs (blood work) drawn today and your tests are completely normal, you will receive your results only by: Marland Kitchen MyChart Message (if you have MyChart) OR . A paper copy in the mail If you have any lab test that is abnormal or we need to change your treatment, we will call you to review the results.  Testing/Procedures: Your physician has requested that you have a lower or upper extremity venous duplex ASAP. This test is an ultrasound of the veins in the legs or arms. It looks at venous blood flow that carries blood from the heart to the legs or arms. Allow one hour for a Lower Venous exam. Allow thirty minutes for an Upper Venous exam. There are no restrictions or special instructions.  Your physician has requested that you have a lexiscan myoview. For further information please visit [email or web address]. Please follow instruction sheet, as given.   Follow-Up: At [facility], you and your health needs are our priority.  As part of our continuing  mission to provide you with exceptional heart care, we have created designated Provider Care Teams.  These Care Teams include your primary Cardiologist (physician) and Advanced Practice Providers (APPs -  Physician Assistants and Nurse Practitioners) who all work together to provide you with the care you need, when you need it. You will need a follow up appointment in 12 months.  Please call our office 2 months in advance to schedule this appointment.  You may see Sinclair Grooms, MD or one of the following Advanced Practice Providers on your designated Care Team:   Truitt Merle, NP Cecilie Kicks, NP . Kathyrn Drown, NP  Any Other Special Instructions Will Be Listed Below (If Applicable).       Signed, Sinclair Grooms, MD  02/23/2018 1:47 PM    [facility]

## 2018-02-23 ENCOUNTER — Ambulatory Visit (INDEPENDENT_AMBULATORY_CARE_PROVIDER_SITE_OTHER): Payer: Medicare Other | Admitting: Interventional Cardiology

## 2018-02-23 ENCOUNTER — Encounter: Payer: Self-pay | Admitting: Interventional Cardiology

## 2018-02-23 ENCOUNTER — Encounter: Payer: Self-pay | Admitting: *Deleted

## 2018-02-23 VITALS — BP 144/84 | HR 57 | Ht 74.0 in | Wt 230.2 lb

## 2018-02-23 DIAGNOSIS — G4733 Obstructive sleep apnea (adult) (pediatric): Secondary | ICD-10-CM

## 2018-02-23 DIAGNOSIS — I10 Essential (primary) hypertension: Secondary | ICD-10-CM | POA: Diagnosis not present

## 2018-02-23 DIAGNOSIS — M7989 Other specified soft tissue disorders: Secondary | ICD-10-CM

## 2018-02-23 DIAGNOSIS — I25709 Atherosclerosis of coronary artery bypass graft(s), unspecified, with unspecified angina pectoris: Secondary | ICD-10-CM

## 2018-02-23 DIAGNOSIS — I6523 Occlusion and stenosis of bilateral carotid arteries: Secondary | ICD-10-CM | POA: Diagnosis not present

## 2018-02-23 DIAGNOSIS — E7849 Other hyperlipidemia: Secondary | ICD-10-CM | POA: Diagnosis not present

## 2018-02-23 NOTE — Patient Instructions (Signed)
Medication Instructions:  Your physician recommends that you continue on your current medications as directed. Please refer to the Current Medication list given to you today.  If you need a refill on your cardiac medications before your next appointment, please call your pharmacy.   Lab work: None If you have labs (blood work) drawn today and your tests are completely normal, you will receive your results only by: Marland Kitchen MyChart Message (if you have MyChart) OR . A paper copy in the mail If you have any lab test that is abnormal or we need to change your treatment, we will call you to review the results.  Testing/Procedures: Your physician has requested that you have a lower or upper extremity venous duplex ASAP. This test is an ultrasound of the veins in the legs or arms. It looks at venous blood flow that carries blood from the heart to the legs or arms. Allow one hour for a Lower Venous exam. Allow thirty minutes for an Upper Venous exam. There are no restrictions or special instructions.  Your physician has requested that you have a lexiscan myoview. For further information please visit [email or web address]. Please follow instruction sheet, as given.   Follow-Up: At [facility], you and your health needs are our priority.  As part of our continuing mission to provide you with exceptional heart care, we have created designated Provider Care Teams.  These Care Teams include your primary Cardiologist (physician) and Advanced Practice Providers (APPs -  Physician Assistants and Nurse Practitioners) who all work together to provide you with the care you need, when you need it. You will need a follow up appointment in 12 months.  Please call our office 2 months in advance to schedule this appointment.  You may see Sinclair Grooms, MD or one of the following Advanced Practice Providers on your designated Care Team:   Truitt Merle, NP Cecilie Kicks, NP . Kathyrn Drown, NP  Any Other Special  Instructions Will Be Listed Below (If Applicable).

## 2018-02-25 ENCOUNTER — Ambulatory Visit (HOSPITAL_COMMUNITY)
Admission: RE | Admit: 2018-02-25 | Discharge: 2018-02-25 | Disposition: A | Discharge status: Home / Self Care | Payer: Medicare Other | Source: Ambulatory Visit | Attending: Cardiology | Admitting: Cardiology

## 2018-02-25 DIAGNOSIS — M7989 Other specified soft tissue disorders: Secondary | ICD-10-CM | POA: Diagnosis present

## 2018-03-04 ENCOUNTER — Telehealth (HOSPITAL_COMMUNITY): Payer: Self-pay | Admitting: *Deleted

## 2018-03-04 NOTE — Telephone Encounter (Signed)
Patient given detailed instructions per Myocardial Perfusion Study Information Sheet for the test on 03/09/18. Patient notified to arrive 15 minutes early and that it is imperative to arrive on time for appointment to keep from having the test rescheduled.  If you need to cancel or reschedule your appointment, please call the office within 24 hours of your appointment. . Patient verbalized understanding.Kirstie Peri

## 2018-03-09 ENCOUNTER — Encounter (HOSPITAL_COMMUNITY): Payer: Medicare Other

## 2018-05-04 ENCOUNTER — Telehealth (HOSPITAL_COMMUNITY): Payer: Self-pay

## 2018-05-04 NOTE — Telephone Encounter (Signed)
Detailed instructions lift on the patients answering machine. Asked to call back with any questions. S.Davine Coba EMTP

## 2018-05-06 ENCOUNTER — Ambulatory Visit (HOSPITAL_COMMUNITY): Payer: Medicare Other | Attending: Cardiology

## 2018-05-06 DIAGNOSIS — I25709 Atherosclerosis of coronary artery bypass graft(s), unspecified, with unspecified angina pectoris: Secondary | ICD-10-CM | POA: Diagnosis present

## 2018-05-06 LAB — MYOCARDIAL PERFUSION IMAGING
CHL CUP NUCLEAR SDS: 1
CHL CUP NUCLEAR SSS: 2
CSEPPHR: 62 {beats}/min
LV dias vol: 158 mL (ref 62–150)
LV sys vol: 64 mL
Rest HR: 52 {beats}/min
SRS: 1
TID: 1

## 2018-05-06 MED ORDER — REGADENOSON 0.4 MG/5ML IV SOLN
0.4000 mg | Freq: Once | INTRAVENOUS | Status: AC
  Administered 2018-05-06: 0.4 mg via INTRAVENOUS

## 2018-05-06 MED ORDER — TECHNETIUM TC 99M TETROFOSMIN IV KIT
31.6000 | PACK | Freq: Once | INTRAVENOUS | Status: AC | PRN
  Administered 2018-05-06: 31.6 via INTRAVENOUS
  Filled 2018-05-06: qty 32

## 2018-05-06 MED ORDER — TECHNETIUM TC 99M TETROFOSMIN IV KIT
9.8000 | PACK | Freq: Once | INTRAVENOUS | Status: AC | PRN
  Administered 2018-05-06: 9.8 via INTRAVENOUS
  Filled 2018-05-06: qty 10

## 2018-11-23 ENCOUNTER — Other Ambulatory Visit: Payer: Self-pay | Admitting: Interventional Cardiology

## 2019-02-25 ENCOUNTER — Telehealth: Payer: Self-pay | Admitting: Interventional Cardiology

## 2019-02-25 NOTE — Telephone Encounter (Signed)
Does pt need carotid repeated this year?

## 2019-02-25 NOTE — Telephone Encounter (Signed)
Np

## 2019-02-25 NOTE — Telephone Encounter (Signed)
New message    Patient requesting order for annual carotid scan

## 2019-02-28 NOTE — Telephone Encounter (Signed)
Spoke with pt and made him aware Carotid not needed this year.  Pt appreciative for call.

## 2019-06-03 ENCOUNTER — Ambulatory Visit: Payer: Medicare Other | Admitting: Interventional Cardiology

## 2019-08-24 ENCOUNTER — Other Ambulatory Visit: Payer: Self-pay | Admitting: Interventional Cardiology

## 2020-03-30 ENCOUNTER — Other Ambulatory Visit: Payer: Self-pay | Admitting: Interventional Cardiology

## 2020-10-28 ENCOUNTER — Encounter: Payer: Self-pay | Admitting: Emergency Medicine

## 2020-10-28 ENCOUNTER — Emergency Department (INDEPENDENT_AMBULATORY_CARE_PROVIDER_SITE_OTHER)
Admission: EM | Admit: 2020-10-28 | Discharge: 2020-10-28 | Disposition: A | Discharge status: Home / Self Care | Payer: Medicare Other | Source: Home / Self Care

## 2020-10-28 ENCOUNTER — Other Ambulatory Visit: Payer: Self-pay

## 2020-10-28 DIAGNOSIS — T63441A Toxic effect of venom of bees, accidental (unintentional), initial encounter: Secondary | ICD-10-CM

## 2020-10-28 DIAGNOSIS — R21 Rash and other nonspecific skin eruption: Secondary | ICD-10-CM | POA: Diagnosis not present

## 2020-10-28 DIAGNOSIS — R110 Nausea: Secondary | ICD-10-CM

## 2020-10-28 MED ORDER — ONDANSETRON 4 MG PO TBDP: 4.0000 mg | ORAL_TABLET | Freq: Once | ORAL | Status: DC

## 2020-10-28 MED ORDER — METHYLPREDNISOLONE 4 MG PO TBPK: ORAL_TABLET | ORAL | 0 refills | Status: AC

## 2020-10-28 MED ORDER — FEXOFENADINE HCL 180 MG PO TABS: 180.0000 mg | ORAL_TABLET | Freq: Every day | ORAL | 0 refills | Status: AC

## 2020-10-28 MED ORDER — METHYLPREDNISOLONE ACETATE 80 MG/ML IJ SUSP
80.0000 mg | Freq: Once | INTRAMUSCULAR | Status: AC
  Administered 2020-10-28: 80 mg via INTRAMUSCULAR

## 2020-10-28 MED ORDER — ONDANSETRON 8 MG PO TBDP: 8.0000 mg | ORAL_TABLET | Freq: Three times a day (TID) | ORAL | 0 refills | Status: AC | PRN

## 2020-10-28 NOTE — ED Triage Notes (Signed)
Patient presents to Urgent Care with complaints of itching all over due to yellow jacket stings since 1 day ago. Patient reports itching all over body from stings, some nausea, unable to sleep. Has tried Benadryl. Denies any sob

## 2020-10-28 NOTE — ED Provider Notes (Signed)
[facility]    CSN: [id number] Arrival date & time: 10/28/20  0954      History   Chief Complaint Chief Complaint  Patient presents with   Insect Bite    HPI [facility] is a 75 y.o. male.   HPI 75 year old male presents with multiple yellowjacket stings for 1 day and reports overall body itching since initial stings.  Additionally, patient reports mild nausea that began earlier this morning.  Past Medical History:  Diagnosis Date   Acne    Allergic rhinitis    Cervical radiculopathy    2008   Coronary artery disease    one-vessel followed by Dr. Tamala Julian   Depression    DJD (degenerative joint disease)    [place]   Ear congestion    ear tubes, [place]   Erectile dysfunction    Fractures    left elbow, right elbow, nose, multiple fingers, left clavicle   Headache    sinus   Hypertension    Knee pain    followed by Dr. Berenice Primas at [place]   Obesity    OSA on CPAP    since 1995. BIPAP   Osteoarthritis    left knee, Dr. Berenice Primas   Plantar fasciitis     Patient Active Problem List   Diagnosis Date Noted   Sinusitis, chronic 03/12/2016   Primary osteoarthritis of right knee 04/21/2014   Carotid artery disease ([place]) 04/18/2014   Coronary artery disease involving coronary bypass graft of native heart with angina pectoris ([place]) 02/22/2013    Class: Chronic   Essential hypertension, benign 02/22/2013    Class: Chronic   Hyperlipidemia 02/22/2013   Obstructive sleep apnea 02/22/2013    Class: Chronic   Osteoarthritis of left knee 02/22/2013    Class: Chronic    Past Surgical History:  Procedure Laterality Date   CARDIAC CATHETERIZATION     drug-eluting stents in circumflev coronary artery, 2006   CORONARY ARTERY BYPASS GRAFT  2000   LIMA TO LAD   ELBOW SURGERY Bilateral    Compound fracture as a kid   HERNIA REPAIR     KNEE ARTHROSCOPY Bilateral    NASAL SINUS SURGERY     NASAL SINUS SURGERY Bilateral 03/12/2016   Procedure:  ENDOSCOPIC SINUS SURGERY;  Surgeon: Jerrell Belfast, MD;  Location: [place];  Service: ENT;  Laterality: Bilateral;   SINUS ENDO WITH FUSION Bilateral 03/12/2016   Procedure: SINUS ENDO WITH FUSION;  Surgeon: Jerrell Belfast, MD;  Location: [place];  Service: ENT;  Laterality: Bilateral;   TONSILLECTOMY     TOTAL KNEE ARTHROPLASTY Left 03/18/2013   Procedure: LEFT TOTAL KNEE ARTHROPLASTY;  Surgeon: Alta Corning, MD;  Location: [place];  Service: Orthopedics;  Laterality: Left;   TOTAL KNEE ARTHROPLASTY Right 04/21/2014   Procedure: TOTAL KNEE ARTHROPLASTY;  Surgeon: Alta Corning, MD;  Location: [place];  Service: Orthopedics;  Laterality: Right;   TYMPANOSTOMY TUBE PLACEMENT         Home Medications    Prior to Admission medications   Medication Sig Start Date End Date Taking? Authorizing Provider  aspirin EC 81 MG tablet Take 81 mg by mouth daily.   Yes [provider]  atorvastatin (LIPITOR) 20 MG tablet Take 1 tablet (20 mg total) by mouth every evening. Please schedule appt for future refills. Thanks! 1st attempt 08/24/19  Yes Belva Crome, MD  citalopram (CELEXA) 10 MG tablet Take 10 mg by mouth every evening. 02/19/16  Yes [provider]  fexofenadine (ALLEGRA ALLERGY) 180 MG tablet Take 1 tablet (180 mg total) by mouth daily for 15 days. 10/28/20 11/12/20 Yes Eliezer Lofts, FNP  lisinopril (PRINIVIL,ZESTRIL) 20 MG tablet Take 1 tablet by mouth daily. 08/17/17  Yes [provider]  methylPREDNISolone (MEDROL DOSEPAK) 4 MG TBPK tablet Take as directed. 10/28/20  Yes Eliezer Lofts, FNP  Misc Natural Products (OSTEO BI-FLEX TRIPLE STRENGTH PO) Take 1 tablet by mouth every evening.    Yes [provider]  Multiple Vitamin (MULTIVITAMIN) tablet Take 1 tablet by mouth daily. Centrum Silver   Yes [provider]  ondansetron (ZOFRAN ODT) 8 MG disintegrating tablet Take 1 tablet (8 mg total) by mouth every 8 (eight) hours as needed for nausea or vomiting.  10/28/20  Yes Eliezer Lofts, FNP  sildenafil (VIAGRA) 50 MG tablet Take 50 mg by mouth daily as needed for erectile dysfunction.   Yes [provider]    Family History Family History  Problem Relation Age of Onset   CAD Mother    Dementia Mother    CAD Father    Hypertension Sister    Diabetes Sister    Gastric cancer Sister    Glaucoma Brother     Social History Social History   Tobacco Use   Smoking status: Never   Smokeless tobacco: Never  Vaping Use   Vaping Use: Never used  Substance Use Topics   Alcohol use: Yes    Alcohol/week: 14.0 standard drinks    Types: 14 Glasses of wine per week    Comment: Daily   Drug use: No     Allergies   Patient has no known allergies.   Review of Systems Review of Systems  Gastrointestinal:  Positive for nausea.  Skin:  Positive for rash.  All other systems reviewed and are negative.   Physical Exam Triage Vital Signs ED Triage Vitals  Enc Vitals Group     BP      Pulse      Resp      Temp      Temp src      SpO2      Weight      Height      Head Circumference      Peak Flow      Pain Score      Pain Loc      Pain Edu?      Excl. in [place]?    No data found.  Updated Vital Signs BP (!) 144/74 (BP Location: Left Arm)   Pulse (!) 55   Temp 97.8 F (36.6 C) (Oral)   Resp 16   SpO2 99%    Physical Exam Vitals and nursing note reviewed.  Constitutional:      General: He is not in acute distress.    Appearance: Normal appearance. He is normal weight. He is not ill-appearing.  HENT:     Head: Normocephalic and atraumatic.     Mouth/Throat:     Mouth: Mucous membranes are moist.     Pharynx: Oropharynx is clear. No posterior oropharyngeal erythema.  Eyes:     Extraocular Movements: Extraocular movements intact.     Conjunctiva/sclera: Conjunctivae normal.     Pupils: Pupils are equal, round, and reactive to light.  Cardiovascular:     Rate and Rhythm: Normal rate and regular rhythm.      Pulses: Normal pulses.     Heart sounds: Normal heart sounds.  Pulmonary:  Effort: Pulmonary effort is normal. No respiratory distress.     Breath sounds: Rhonchi present. No wheezing or rales.  Musculoskeletal:        General: Normal range of motion.     Cervical back: Normal range of motion and neck supple. No tenderness.  Lymphadenopathy:     Cervical: No cervical adenopathy.  Skin:    General: Skin is warm and dry.     Comments: Left upper/lower arm (volar aspects), right hand (dorsum), lower back left-sided: Erythematous, raised maculopapular pruritic plaques noted.  Neurological:     General: No focal deficit present.     Mental Status: He is alert.  Psychiatric:        Mood and Affect: Mood normal.        Behavior: Behavior normal.        Thought Content: Thought content normal.     UC Treatments / Results  Labs (all labs ordered are listed, but only abnormal results are displayed) Labs Reviewed - No data to display  EKG   Radiology No results found.  Procedures Procedures (including critical care time)  Medications Ordered in UC Medications  ondansetron (ZOFRAN-ODT) disintegrating tablet 4 mg (has no administration in time range)  methylPREDNISolone acetate (DEPO-MEDROL) injection 80 mg (80 mg Intramuscular Given 10/28/20 1134)    Initial Impression / Assessment and Plan / UC Course  I have reviewed the triage vital signs and the nursing notes.  Pertinent labs & imaging results that were available during my care of the patient were reviewed by me and considered in my medical decision making (see chart for details).    MDM: 1.  Bee sting reaction-patient had taken OTC Benadryl at home prior to visit today, 2.  Nausea without vomiting-Rx'd Zofran, 1 p.o. 4 mg ODT Zofran given in clinic today, 3.  Rash/nonspecific skin eruption-IM Depo-Medrol given once in clinic today, Rx'd Medrol Dosepak which patient will start tomorrow, Monday, 10/29/2020.  Discharged home,  hemodynamically stable. Final Clinical Impressions(s) / UC Diagnoses   Final diagnoses:  Bee sting reaction, accidental or unintentional, initial encounter  Nausea without vomiting  Rash and nonspecific skin eruption     Discharge Instructions      Advised/instructed patient that medication as directed with food to completion.  Instructed patient not to start Medrol Dosepak until tomorrow morning, Monday, 10/29/2020.  Advised patient may take Allegra daily for the next 5 days, then as needed. Advised/encouraged patient to increase daily water intake while taking these medications.     ED Prescriptions     Medication Sig Dispense Auth. Provider   ondansetron (ZOFRAN ODT) 8 MG disintegrating tablet Take 1 tablet (8 mg total) by mouth every 8 (eight) hours as needed for nausea or vomiting. 20 tablet Eliezer Lofts, FNP   methylPREDNISolone (MEDROL DOSEPAK) 4 MG TBPK tablet Take as directed. 1 each Eliezer Lofts, FNP   fexofenadine [facility] ALLERGY) 180 MG tablet Take 1 tablet (180 mg total) by mouth daily for 15 days. 15 tablet Eliezer Lofts, FNP      PDMP not reviewed this encounter.   Eliezer Lofts, [place] 10/28/20 1139

## 2020-10-28 NOTE — Discharge Instructions (Addendum)
Advised/instructed patient that medication as directed with food to completion.  Instructed patient not to start Medrol Dosepak until tomorrow morning, Monday, 10/29/2020.  Advised patient may take Allegra daily for the next 5 days, then as needed. Advised/encouraged patient to increase daily water intake while taking these medications.
